# Patient Record
Sex: Female | Born: 1970
Health system: Southern US, Community
[De-identification: ages and names within clinical notes are randomized; demographics above are authoritative.]

## PROBLEM LIST (undated history)

## (undated) DIAGNOSIS — L509 Urticaria, unspecified: Secondary | ICD-10-CM

## (undated) DIAGNOSIS — Z8719 Personal history of other diseases of the digestive system: Secondary | ICD-10-CM

## (undated) DIAGNOSIS — K219 Gastro-esophageal reflux disease without esophagitis: Secondary | ICD-10-CM

## (undated) HISTORY — PX: ABDOMINAL HYSTERECTOMY: SHX81

## (undated) HISTORY — PX: TUBAL LIGATION: SHX77

## (undated) HISTORY — DX: Urticaria, unspecified: L50.9

---

## 2002-09-08 ENCOUNTER — Other Ambulatory Visit: Admission: RE | Admit: 2002-09-08 | Discharge: 2002-09-08 | Payer: Self-pay | Admitting: Family Medicine

## 2007-02-09 ENCOUNTER — Other Ambulatory Visit: Admission: RE | Admit: 2007-02-09 | Discharge: 2007-02-09 | Payer: Self-pay | Admitting: *Deleted

## 2007-08-09 ENCOUNTER — Emergency Department (HOSPITAL_COMMUNITY): Admission: EM | Admit: 2007-08-09 | Discharge: 2007-08-10 | Payer: Self-pay | Admitting: Emergency Medicine

## 2007-08-11 ENCOUNTER — Emergency Department (HOSPITAL_COMMUNITY): Admission: EM | Admit: 2007-08-11 | Discharge: 2007-08-11 | Payer: Self-pay | Admitting: Emergency Medicine

## 2008-09-21 ENCOUNTER — Emergency Department (HOSPITAL_COMMUNITY): Admission: EM | Admit: 2008-09-21 | Discharge: 2008-09-21 | Payer: Self-pay | Admitting: Emergency Medicine

## 2009-02-21 ENCOUNTER — Emergency Department (HOSPITAL_COMMUNITY): Admission: EM | Admit: 2009-02-21 | Discharge: 2009-02-21 | Payer: Self-pay | Admitting: Emergency Medicine

## 2009-09-19 ENCOUNTER — Emergency Department (HOSPITAL_COMMUNITY): Admission: EM | Admit: 2009-09-19 | Discharge: 2009-09-19 | Payer: Self-pay | Admitting: Emergency Medicine

## 2010-02-05 ENCOUNTER — Emergency Department (HOSPITAL_COMMUNITY): Admission: EM | Admit: 2010-02-05 | Discharge: 2010-02-05 | Payer: Self-pay | Admitting: Emergency Medicine

## 2010-09-19 ENCOUNTER — Emergency Department (HOSPITAL_COMMUNITY): Admission: EM | Admit: 2010-09-19 | Discharge: 2010-09-19 | Payer: Self-pay | Admitting: Emergency Medicine

## 2011-03-13 LAB — URINALYSIS, ROUTINE W REFLEX MICROSCOPIC
Glucose, UA: NEGATIVE mg/dL
Specific Gravity, Urine: >1.03 — ABNORMAL HIGH (ref 1.005–1.030)
Urobilinogen, UA: 0.2 mg/dL (ref 0.0–1.0)

## 2011-03-13 LAB — URINE MICROSCOPIC-ADD ON

## 2011-03-13 LAB — URINE CULTURE

## 2011-09-02 LAB — DIFFERENTIAL
Basophils Absolute: 0.1
Basophils Relative: 1
Eosinophils Absolute: 0.1
Eosinophils Relative: 1
Eosinophils Relative: 1
Lymphocytes Relative: 25
Monocytes Absolute: 0.5
Monocytes Relative: 6
Neutro Abs: 5.6
Neutrophils Relative %: 68

## 2011-09-02 LAB — COMPREHENSIVE METABOLIC PANEL
ALT: 25
AST: 32
Albumin: 3.4 — ABNORMAL LOW
Alkaline Phosphatase: 71
CO2: 29
Chloride: 100
Chloride: 102
Creatinine, Ser: 0.81
GFR calc Af Amer: >60
GFR calc non Af Amer: >60
Glucose, Bld: 94
Potassium: 4
Potassium: 4.1
Sodium: 135
Total Bilirubin: 0.9
Total Bilirubin: 1
Total Protein: 6.8

## 2011-09-02 LAB — URINALYSIS, ROUTINE W REFLEX MICROSCOPIC
Bilirubin Urine: NEGATIVE
Nitrite: NEGATIVE
Specific Gravity, Urine: 1.02
pH: 7

## 2011-09-02 LAB — CBC
Hemoglobin: 13.1
MCHC: 34.1
MCV: 89.3
Platelets: 227
RBC: 4.29
RDW: 13.3
WBC: 8.4
WBC: 8.8

## 2011-09-02 LAB — LIPASE, BLOOD: Lipase: 25

## 2013-08-05 ENCOUNTER — Encounter (HOSPITAL_COMMUNITY): Payer: Self-pay | Admitting: *Deleted

## 2013-08-05 ENCOUNTER — Emergency Department (HOSPITAL_COMMUNITY)
Admission: EM | Admit: 2013-08-05 | Discharge: 2013-08-05 | Disposition: A | Discharge status: Home / Self Care | Payer: Self-pay | Attending: Emergency Medicine | Admitting: Emergency Medicine

## 2013-08-05 DIAGNOSIS — M79609 Pain in unspecified limb: Secondary | ICD-10-CM | POA: Insufficient documentation

## 2013-08-05 DIAGNOSIS — M5430 Sciatica, unspecified side: Secondary | ICD-10-CM | POA: Insufficient documentation

## 2013-08-05 DIAGNOSIS — M5432 Sciatica, left side: Secondary | ICD-10-CM

## 2013-08-05 DIAGNOSIS — R209 Unspecified disturbances of skin sensation: Secondary | ICD-10-CM | POA: Insufficient documentation

## 2013-08-05 MED ORDER — MELOXICAM 7.5 MG PO TABS: 7.5000 mg | ORAL_TABLET | Freq: Every day | ORAL | Status: DC

## 2013-08-05 MED ORDER — CYCLOBENZAPRINE HCL 10 MG PO TABS: 10.0000 mg | ORAL_TABLET | Freq: Two times a day (BID) | ORAL | Status: DC | PRN

## 2013-08-05 MED ORDER — HYDROCODONE-ACETAMINOPHEN 5-325 MG PO TABS: 1.0000 | ORAL_TABLET | ORAL | Status: DC | PRN

## 2013-08-05 NOTE — ED Provider Notes (Signed)
Medical screening examination/treatment/procedure(s) were performed by non-physician practitioner and as supervising physician I was immediately available for consultation/collaboration.   Glynn Octave, MD 08/05/13 [phone]

## 2013-08-05 NOTE — ED Notes (Signed)
Pain lower back and down lt leg with numbness.  X 3 days. No known injury

## 2013-08-05 NOTE — ED Provider Notes (Signed)
CSN: [id number]     Arrival date & time 08/05/13  1741 History   First MD Initiated Contact with Patient 08/05/13 1812     Chief Complaint  Patient presents with  . Back Pain   (Consider location/radiation/quality/duration/timing/severity/associated sxs/prior Treatment) Patient is a 42 y.o. female presenting with back pain. The history is provided by the patient.  Back Pain Location:  Lumbar spine Quality:  Shooting Radiates to:  L posterior upper leg Pain severity:  Moderate (5/10) Pain is:  Same all the time Onset quality:  Gradual Duration:  3 days Timing:  Constant Progression:  Worsening Chronicity:  New Relieved by:  Nothing Worsened by:  Movement, twisting and standing Ineffective treatments: tylenol. Associated symptoms: leg pain, numbness and tingling   Associated symptoms: no abdominal pain, no bladder incontinence, no bowel incontinence, no dysuria and no fever     History reviewed. No pertinent past medical history. Past Surgical History  Procedure Laterality Date  . Abdominal hysterectomy    . Tubal ligation     History reviewed. No pertinent family history. History  Substance Use Topics  . Smoking status: Never Smoker   . Smokeless tobacco: Not on file  . Alcohol Use: No   OB History   Grav Para Term Preterm Abortions TAB SAB Ect Mult Living                 Review of Systems  Constitutional: Negative for fever.  HENT: Negative for neck pain.   Respiratory: Negative for shortness of breath.   Gastrointestinal: Negative for nausea, vomiting, abdominal pain and bowel incontinence.  Genitourinary: Negative for bladder incontinence and dysuria.  Musculoskeletal: Positive for back pain.  Skin: Negative for wound.  Allergic/Immunologic: Negative for immunocompromised state.  Neurological: Positive for tingling and numbness.  Psychiatric/Behavioral: The patient is not nervous/anxious.     Allergies  Review of patient's allergies indicates no known  allergies.  Home Medications  No current outpatient prescriptions on file. BP 148/76  Pulse 67  Temp(Src) 98.2 F (36.8 C) (Oral)  Resp 16  Ht 5\' 5"  (1.651 m)  Wt 270 lb (122.471 kg)  BMI 44.93 kg/m2  SpO2 98% Physical Exam  Nursing note and vitals reviewed. Constitutional: She is oriented to person, place, and time. She appears well-developed and well-nourished. No distress.  HENT:  Head: Normocephalic and atraumatic.  Eyes: Conjunctivae and EOM are normal.  Neck: Normal range of motion. Neck supple.  Cardiovascular: Normal rate and regular rhythm.   Pulmonary/Chest: Effort normal and breath sounds normal.  Abdominal: Soft. There is no tenderness.  Musculoskeletal:       Back:  Tenderness over the left sciatic nerve with radiation to the left upper leg dorsal aspect. Pedal pulses equal, adequate circulation, good touch sensation. Straight leg raises without difficulty. Pain with bending forward and side to side.  Neurological: She is alert and oriented to person, place, and time. No cranial nerve deficit.  Skin: Skin is warm and dry.  Psychiatric: She has a normal mood and affect. Her behavior is normal.    ED Course  Procedures  MDM  42 y.o. female with sciatica left with pain radiating to the left leg. Will treat with muscle relaxant and pain medications. She will follow up with ortho if symptoms persist.  Discussed with the patient and all questioned fully answered.    Medication List         cyclobenzaprine 10 MG tablet  Commonly known as:  FLEXERIL  Take 1 tablet (  10 mg total) by mouth 2 (two) times daily as needed for muscle spasms.     HYDROcodone-acetaminophen 5-325 MG per tablet  Commonly known as:  NORCO/VICODIN  Take 1 tablet by mouth every 4 (four) hours as needed.     meloxicam 7.5 MG tablet  Commonly known as:  MOBIC  Take 1 tablet (7.5 mg total) by mouth daily.         [facility] Orlene Och, NP 08/05/13 1910

## 2013-11-20 ENCOUNTER — Emergency Department (HOSPITAL_COMMUNITY): Payer: Self-pay

## 2013-11-20 ENCOUNTER — Other Ambulatory Visit: Payer: Self-pay

## 2013-11-20 ENCOUNTER — Encounter (HOSPITAL_COMMUNITY): Payer: Self-pay | Admitting: Emergency Medicine

## 2013-11-20 ENCOUNTER — Emergency Department (HOSPITAL_COMMUNITY)
Admission: EM | Admit: 2013-11-20 | Discharge: 2013-11-20 | Disposition: A | Discharge status: Home / Self Care | Payer: Self-pay | Attending: Emergency Medicine | Admitting: Emergency Medicine

## 2013-11-20 DIAGNOSIS — R110 Nausea: Secondary | ICD-10-CM | POA: Insufficient documentation

## 2013-11-20 DIAGNOSIS — R05 Cough: Secondary | ICD-10-CM | POA: Insufficient documentation

## 2013-11-20 DIAGNOSIS — R51 Headache: Secondary | ICD-10-CM | POA: Insufficient documentation

## 2013-11-20 DIAGNOSIS — Z90710 Acquired absence of both cervix and uterus: Secondary | ICD-10-CM | POA: Insufficient documentation

## 2013-11-20 DIAGNOSIS — R079 Chest pain, unspecified: Secondary | ICD-10-CM | POA: Insufficient documentation

## 2013-11-20 DIAGNOSIS — B9789 Other viral agents as the cause of diseases classified elsewhere: Secondary | ICD-10-CM | POA: Insufficient documentation

## 2013-11-20 DIAGNOSIS — Z9851 Tubal ligation status: Secondary | ICD-10-CM | POA: Insufficient documentation

## 2013-11-20 DIAGNOSIS — B349 Viral infection, unspecified: Secondary | ICD-10-CM

## 2013-11-20 DIAGNOSIS — R42 Dizziness and giddiness: Secondary | ICD-10-CM | POA: Insufficient documentation

## 2013-11-20 DIAGNOSIS — R059 Cough, unspecified: Secondary | ICD-10-CM | POA: Insufficient documentation

## 2013-11-20 LAB — CBC WITH DIFFERENTIAL/PLATELET
Eosinophils Absolute: 0.1 10*3/uL (ref 0.0–0.7)
Eosinophils Relative: 1 % (ref 0–5)
HCT: 38.6 % (ref 36.0–46.0)
Hemoglobin: 13.3 g/dL (ref 12.0–15.0)
Lymphocytes Relative: 49 % — ABNORMAL HIGH (ref 12–46)
Lymphs Abs: 3.2 10*3/uL (ref 0.7–4.0)
MCH: 29.7 pg (ref 26.0–34.0)
MCV: 86.2 fL (ref 78.0–100.0)
Monocytes Absolute: 0.3 10*3/uL (ref 0.1–1.0)
Monocytes Relative: 4 % (ref 3–12)
Platelets: 270 10*3/uL (ref 150–400)
RBC: 4.48 MIL/uL (ref 3.87–5.11)
WBC: 6.6 10*3/uL (ref 4.0–10.5)

## 2013-11-20 LAB — BASIC METABOLIC PANEL
BUN: 10 mg/dL (ref 6–23)
CO2: 25 mEq/L (ref 19–32)
Calcium: 9.8 mg/dL (ref 8.4–10.5)
Creatinine, Ser: 0.83 mg/dL (ref 0.50–1.10)
GFR calc non Af Amer: 85 mL/min — ABNORMAL LOW (ref >90)
Glucose, Bld: 106 mg/dL — ABNORMAL HIGH (ref 70–99)
Sodium: 138 mEq/L (ref 135–145)

## 2013-11-20 MED ORDER — MECLIZINE HCL 12.5 MG PO TABS
25.0000 mg | ORAL_TABLET | Freq: Once | ORAL | Status: AC
  Administered 2013-11-20: 25 mg via ORAL
  Filled 2013-11-20: qty 2

## 2013-11-20 MED ORDER — ONDANSETRON HCL 4 MG/2ML IJ SOLN
4.0000 mg | Freq: Once | INTRAMUSCULAR | Status: AC
  Administered 2013-11-20: 4 mg via INTRAVENOUS
  Filled 2013-11-20: qty 2

## 2013-11-20 MED ORDER — MECLIZINE HCL 25 MG PO TABS: 25.0000 mg | ORAL_TABLET | Freq: Four times a day (QID) | ORAL | Status: DC

## 2013-11-20 MED ORDER — SODIUM CHLORIDE 0.9 % IV BOLUS (SEPSIS)
500.0000 mL | Freq: Once | INTRAVENOUS | Status: AC
  Administered 2013-11-20: 500 mL via INTRAVENOUS

## 2013-11-20 MED ORDER — SODIUM CHLORIDE 0.9 % IV SOLN: INTRAVENOUS | Status: DC

## 2013-11-20 MED ORDER — NAPROXEN 500 MG PO TABS: 500.0000 mg | ORAL_TABLET | Freq: Two times a day (BID) | ORAL | Status: DC

## 2013-11-20 NOTE — ED Notes (Signed)
Pt c/o sudden onset dizziness/nausea/weakness and cp today.

## 2013-11-20 NOTE — ED Provider Notes (Signed)
CSN: [id number]     Arrival date & time 11/20/13  1641 History  This chart was scribed for Shelda Jakes, MD by Quintella Reichert, ED scribe.  This patient was seen in room APA01/APA01 and the patient's care was started at 5:01 PM.   Chief Complaint  Patient presents with  . Dizziness  . Chest Pain    Patient is a 42 y.o. female presenting with dizziness and chest pain. The history is provided by the patient.  Dizziness Quality:  Room spinning Severity:  Moderate Duration:  4 hours Timing:  Constant Chronicity:  New Exacerbated by: head movement. Ineffective treatments:  None tried Associated symptoms: chest pain, headaches and nausea   Associated symptoms: no diarrhea, no shortness of breath, no tinnitus and no vomiting   Chest Pain Pain location:  L chest Pain quality: aching   Pain radiates to:  Does not radiate Pain severity:  Mild Onset quality:  Sudden Duration:  4 hours Timing:  Constant Chronicity:  New Relieved by:  Nothing Worsened by:  Nothing tried Associated symptoms: cough, dizziness, headache and nausea   Associated symptoms: no abdominal pain, no back pain, no fever, no shortness of breath and not vomiting   Risk factors: no aortic disease, no coronary artery disease, no diabetes mellitus, no high cholesterol, no hypertension, not female, no prior DVT/PE and no smoking     HPI Comments: Krista Fowler is a 42 y.o. female who presents to the Emergency Department complaining of 4 hours of sudden-onset mild left-sided chest pain with associated nausea and dizziness.  Pain is described as "a constant ache."  It is not worsened or relieved by anything and currently she rates it at a 4/10.  Shortly after she developed nausea and dizziness described as a room-spinning sensation.  Dizziness is worsened by moving her head suddenly.  She also complains of a mild headache and admits to a mild cough that has been ongoing for the past several weeks.  She denies vomiting,  diarrhea, fever, SOB, abdominal pain, myalgias, or any other associated symptoms.  Pt denies recent sick contacts.  She denies prior h/o chest pain.  She denies h/o heart problems or any other pertinent medical history.  Pt has no PCP.   History reviewed. No pertinent past medical history.  Past Surgical History  Procedure Laterality Date  . Abdominal hysterectomy    . Tubal ligation      No family history on file.  History  Substance Use Topics  . Smoking status: Never Smoker   . Smokeless tobacco: Not on file  . Alcohol Use: No    OB History   Grav Para Term Preterm Abortions TAB SAB Ect Mult Living                  Review of Systems  Constitutional: Negative for fever and chills.  HENT: Negative for congestion, ear pain, rhinorrhea and tinnitus.   Eyes: Negative for visual disturbance.  Respiratory: Positive for cough. Negative for shortness of breath.   Cardiovascular: Positive for chest pain. Negative for leg swelling.  Gastrointestinal: Positive for nausea. Negative for vomiting, abdominal pain and diarrhea.  Genitourinary: Negative for dysuria.  Musculoskeletal: Negative for back pain, myalgias and neck pain.  Skin: Negative for rash.  Neurological: Positive for dizziness and headaches.  Hematological: Does not bruise/bleed easily.  Psychiatric/Behavioral: Negative for confusion.     Allergies  Review of patient's allergies indicates no known allergies.  Home Medications   Current Outpatient  Rx  Name  Route  Sig  Dispense  Refill  . meclizine (ANTIVERT) 25 MG tablet   Oral   Take 1 tablet (25 mg total) by mouth 4 (four) times daily.   28 tablet   0   . naproxen (NAPROSYN) 500 MG tablet   Oral   Take 1 tablet (500 mg total) by mouth 2 (two) times daily.   14 tablet   0    BP 121/87  Pulse 61  Temp(Src) 98.6 F (37 C) (Oral)  Resp 20  Ht 5\' 5"  (1.651 m)  Wt 270 lb (122.471 kg)  BMI 44.93 kg/m2  SpO2 100%  Physical Exam  Nursing note and  vitals reviewed. Constitutional: She is oriented to person, place, and time. She appears well-developed and well-nourished. No distress.  HENT:  Head: Normocephalic and atraumatic.  Mouth/Throat: Mucous membranes are not dry.  Eyes: Conjunctivae and EOM are normal. Pupils are equal, round, and reactive to light.  Neck: Neck supple. No tracheal deviation present.  Cardiovascular: Normal rate, regular rhythm and normal heart sounds.   No murmur heard. Pulmonary/Chest: Effort normal and breath sounds normal. No respiratory distress. She has no wheezes. She has no rales.  Abdominal: Bowel sounds are normal. There is no tenderness.  Musculoskeletal: Normal range of motion.  Lymphadenopathy:    She has no cervical adenopathy.  Neurological: She is alert and oriented to person, place, and time. No cranial nerve deficit.  Skin: Skin is warm and dry.  Psychiatric: She has a normal mood and affect. Her behavior is normal.    ED Course  Procedures (including critical care time)  DIAGNOSTIC STUDIES: Oxygen Saturation is 100% on room air, normal by my interpretation.    COORDINATION OF CARE: 5:07 PM-Informed pt that EKG is unconcerning and symptoms are most likely due to a viral infection.  Discussed treatment plan which includes cardiac workup to rule out other causes with pt at bedside and pt agreed to plan.    Labs Review Labs Reviewed  CBC WITH DIFFERENTIAL - Abnormal; Notable for the following:    Lymphocytes Relative 49 (*)    All other components within normal limits  BASIC METABOLIC PANEL - Abnormal; Notable for the following:    Glucose, Bld 106 (*)    GFR calc non Af Amer 85 (*)    All other components within normal limits  TROPONIN I   Results for orders placed during the hospital encounter of 11/20/13  TROPONIN I      Result Value Range   Troponin I <0.30  <0.30 ng/mL  CBC WITH DIFFERENTIAL      Result Value Range   WBC 6.6  4.0 - 10.5 K/uL   RBC 4.48  3.87 - 5.11 MIL/uL    Hemoglobin 13.3  12.0 - 15.0 g/dL   HCT 16.1  09.6 - 04.5 %   MCV 86.2  78.0 - 100.0 fL   MCH 29.7  26.0 - 34.0 pg   MCHC 34.5  30.0 - 36.0 g/dL   RDW 40.9  81.1 - 91.4 %   Platelets 270  150 - 400 K/uL   Neutrophils Relative % 46  43 - 77 %   Neutro Abs 3.0  1.7 - 7.7 K/uL   Lymphocytes Relative 49 (*) 12 - 46 %   Lymphs Abs 3.2  0.7 - 4.0 K/uL   Monocytes Relative 4  3 - 12 %   Monocytes Absolute 0.3  0.1 - 1.0 K/uL   Eosinophils  Relative 1  0 - 5 %   Eosinophils Absolute 0.1  0.0 - 0.7 K/uL   Basophils Relative 0  0 - 1 %   Basophils Absolute 0.0  0.0 - 0.1 K/uL  BASIC METABOLIC PANEL      Result Value Range   Sodium 138  135 - 145 mEq/L   Potassium 3.6  3.5 - 5.1 mEq/L   Chloride 100  96 - 112 mEq/L   CO2 25  19 - 32 mEq/L   Glucose, Bld 106 (*) 70 - 99 mg/dL   BUN 10  6 - 23 mg/dL   Creatinine, Ser 1.61  0.50 - 1.10 mg/dL   Calcium 9.8  8.4 - 09.6 mg/dL   GFR calc non Af Amer 85 (*) >90 mL/min   GFR calc Af Amer >90  >90 mL/min    Imaging Review Ct Head Wo Contrast  11/20/2013   CLINICAL DATA:  Dizziness and vomiting this morning.  EXAM: CT HEAD WITHOUT CONTRAST  TECHNIQUE: Contiguous axial images were obtained from the base of the skull through the vertex without intravenous contrast.  COMPARISON:  None.  FINDINGS: No mass lesion, mass effect, midline shift, hydrocephalus, hemorrhage. No territorial ischemia or acute infarction. Calvarium intact.  IMPRESSION: Negative CT head.   Electronically Signed   By: Andreas Newport M.D.   On: 11/20/2013 17:54   Dg Chest Portable 1 View  11/20/2013   CLINICAL DATA:  Chest pain.  Dizziness.  Nausea.  EXAM: PORTABLE CHEST - 1 VIEW  COMPARISON:  09/19/2010.  FINDINGS: Cardiopericardial silhouette within normal limits. Mediastinal contours normal. Trachea midline. No airspace disease or effusion.  IMPRESSION: No active disease.   Electronically Signed   By: Andreas Newport M.D.   On: 11/20/2013 17:05    EKG Interpretation   None       Date: 11/20/2013  Rate: 56  Rhythm: sinus bradycardia  QRS Axis: normal  Intervals: normal  ST/T Wave abnormalities: normal  Conduction Disutrbances:none  Narrative Interpretation:   Old EKG Reviewed: none available    MDM   1. Vertigo   2. Viral illness    Symptoms most likely viral in nature and consistent with vertigo. Chest pain workup negative EKG without acute changes. Troponin negative chest x-ray negative for pneumonia pneumothorax or pulmonary edema. Do not feel that this is cardiac chest pain. Patient's symptoms are predominantly of positional vertigo. Most likely due to a sudden onset of a virus it started at 1:00 this afternoon. Patient treated with anti-vert with some improvement but not completely resolved same medication we continued at home referral provided to neurology. Patient's head CT was done as well without any acute cranial abnormalities. If symptoms persist MRI of the brain would be appropriate.    I personally performed the services described in this documentation, which was scribed in my presence. The recorded information has been reviewed and is accurate.     Shelda Jakes, MD 11/20/13 [phone]

## 2013-11-20 NOTE — ED Notes (Signed)
Pt stood up and had no difficulty walking and states dizziness much better.

## 2013-12-16 ENCOUNTER — Other Ambulatory Visit: Payer: Self-pay | Admitting: General Practice

## 2013-12-20 ENCOUNTER — Ambulatory Visit: Payer: Self-pay | Admitting: General Practice

## 2014-05-08 ENCOUNTER — Telehealth: Payer: Self-pay | Admitting: General Practice

## 2014-05-11 NOTE — Telephone Encounter (Signed)
[employer] [facility] for 05/29/14 with [place].  Pt will call to cancel if she can get in somewhere else quicker.

## 2014-05-29 ENCOUNTER — Encounter: Payer: Self-pay | Admitting: Family

## 2014-07-31 ENCOUNTER — Encounter (HOSPITAL_COMMUNITY): Payer: Self-pay | Admitting: Emergency Medicine

## 2014-07-31 ENCOUNTER — Emergency Department (HOSPITAL_COMMUNITY)
Admission: EM | Admit: 2014-07-31 | Discharge: 2014-07-31 | Disposition: A | Discharge status: Home / Self Care | Payer: Self-pay | Attending: Emergency Medicine | Admitting: Emergency Medicine

## 2014-07-31 DIAGNOSIS — K644 Residual hemorrhoidal skin tags: Secondary | ICD-10-CM | POA: Insufficient documentation

## 2014-07-31 DIAGNOSIS — K625 Hemorrhage of anus and rectum: Secondary | ICD-10-CM | POA: Insufficient documentation

## 2014-07-31 LAB — BASIC METABOLIC PANEL
ANION GAP: 10 (ref 5–15)
BUN: 11 mg/dL (ref 6–23)
CHLORIDE: 103 meq/L (ref 96–112)
CO2: 28 mEq/L (ref 19–32)
Calcium: 9.3 mg/dL (ref 8.4–10.5)
Creatinine, Ser: 0.91 mg/dL (ref 0.50–1.10)
GFR calc Af Amer: 88 mL/min — ABNORMAL LOW (ref >90)
GFR calc non Af Amer: 76 mL/min — ABNORMAL LOW (ref >90)
Glucose, Bld: 100 mg/dL — ABNORMAL HIGH (ref 70–99)
POTASSIUM: 4 meq/L (ref 3.7–5.3)
Sodium: 141 mEq/L (ref 137–147)

## 2014-07-31 LAB — CBC WITH DIFFERENTIAL/PLATELET
BASOS PCT: 0 % (ref 0–1)
Basophils Absolute: 0 10*3/uL (ref 0.0–0.1)
Eosinophils Absolute: 0.2 10*3/uL (ref 0.0–0.7)
Eosinophils Relative: 2 % (ref 0–5)
HCT: 37 % (ref 36.0–46.0)
Hemoglobin: 12.7 g/dL (ref 12.0–15.0)
LYMPHS ABS: 3.2 10*3/uL (ref 0.7–4.0)
Lymphocytes Relative: 52 % — ABNORMAL HIGH (ref 12–46)
MCH: 29.5 pg (ref 26.0–34.0)
MCHC: 34.3 g/dL (ref 30.0–36.0)
MCV: 85.8 fL (ref 78.0–100.0)
MONOS PCT: 7 % (ref 3–12)
Monocytes Absolute: 0.5 10*3/uL (ref 0.1–1.0)
NEUTROS ABS: 2.5 10*3/uL (ref 1.7–7.7)
Neutrophils Relative %: 39 % — ABNORMAL LOW (ref 43–77)
PLATELETS: 266 10*3/uL (ref 150–400)
RBC: 4.31 MIL/uL (ref 3.87–5.11)
RDW: 12.9 % (ref 11.5–15.5)
WBC: 6.4 10*3/uL (ref 4.0–10.5)

## 2014-07-31 LAB — URINALYSIS, ROUTINE W REFLEX MICROSCOPIC
Bilirubin Urine: NEGATIVE
Glucose, UA: NEGATIVE mg/dL
HGB URINE DIPSTICK: NEGATIVE
Ketones, ur: NEGATIVE mg/dL
NITRITE: NEGATIVE
Protein, ur: NEGATIVE mg/dL
SPECIFIC GRAVITY, URINE: 1.02 (ref 1.005–1.030)
Urobilinogen, UA: 1 mg/dL (ref 0.0–1.0)
pH: 7 (ref 5.0–8.0)

## 2014-07-31 LAB — URINE MICROSCOPIC-ADD ON

## 2014-07-31 NOTE — Discharge Instructions (Signed)
Hemorrhoids Hemorrhoids are swollen veins around the rectum or anus. There are two types of hemorrhoids:   Internal hemorrhoids. These occur in the veins just inside the rectum. They may poke through to the outside and become irritated and painful.  External hemorrhoids. These occur in the veins outside the anus and can be felt as a painful swelling or hard lump near the anus. CAUSES  Pregnancy.   Obesity.   Constipation or diarrhea.   Straining to have a bowel movement.   Sitting for long periods on the toilet.  Heavy lifting or other activity that caused you to strain.  Anal intercourse. SYMPTOMS   Pain.   Anal itching or irritation.   Rectal bleeding.   Fecal leakage.   Anal swelling.   One or more lumps around the anus.  DIAGNOSIS  Your caregiver may be able to diagnose hemorrhoids by visual examination. Other examinations or tests that may be performed include:   Examination of the rectal area with a gloved hand (digital rectal exam).   Examination of anal canal using a small tube (scope).   A blood test if you have lost a significant amount of blood.  A test to look inside the colon (sigmoidoscopy or colonoscopy). TREATMENT Most hemorrhoids can be treated at home. However, if symptoms do not seem to be getting better or if you have a lot of rectal bleeding, your caregiver may perform a procedure to help make the hemorrhoids get smaller or remove them completely. Possible treatments include:   Placing a rubber band at the base of the hemorrhoid to cut off the circulation (rubber band ligation).   Injecting a chemical to shrink the hemorrhoid (sclerotherapy).   Using a tool to burn the hemorrhoid (infrared light therapy).   Surgically removing the hemorrhoid (hemorrhoidectomy).   Stapling the hemorrhoid to block blood flow to the tissue (hemorrhoid stapling).  HOME CARE INSTRUCTIONS   Eat foods with fiber, such as whole grains, beans,  nuts, fruits, and vegetables. Ask your doctor about taking products with added fiber in them (fibersupplements).  You may also take Miralax and Colace over the counter as needed to keep your stool soft.  Increase fluid intake. Drink enough water and fluids to keep your urine clear or pale yellow.   Exercise regularly.   Go to the bathroom when you have the urge to have a bowel movement. Do not wait.   Avoid straining to have bowel movements.   Keep the anal area dry and clean. Use wet toilet paper or moist towelettes after a bowel movement.   Medicated creams and suppositories may be used or applied as directed. --->  Preparation H, Tuck's witch hazel wipes  Only take over-the-counter or prescription medicines as directed by your caregiver.   Take warm sitz baths for 15-20 minutes, 3-4 times a day to ease pain and discomfort.   Place ice packs on the hemorrhoids if they are tender and swollen. Using ice packs between sitz baths may be helpful.   Put ice in a plastic bag.   Place a towel between your skin and the bag.   Leave the ice on for 15-20 minutes, 3-4 times a day.   Do not use a donut-shaped pillow or sit on the toilet for long periods. This increases blood pooling and pain.  SEEK MEDICAL CARE IF:  You have increasing pain and swelling that is not controlled by treatment or medicine.  You have uncontrolled bleeding.  You have difficulty or you are  unable to have a bowel movement.  You have pain or inflammation outside the area of the hemorrhoids. MAKE SURE YOU:  Understand these instructions.  Will watch your condition.  Will get help right away if you are not doing well or get worse. Document Released: 11/14/2000 Document Revised: 11/03/2012 Document Reviewed: 09/21/2012 [facility] Patient Information 2015 [place], [place]. This information is not intended to replace advice given to you by your health care provider. Make sure you discuss any questions  you have with your health care provider.

## 2014-07-31 NOTE — ED Provider Notes (Signed)
TIME SEEN: 5:29 PM  CHIEF COMPLAINT: Rectal bleeding  HPI: Patient is a 43 year old female with no significant past medical history who presents to the emergency department with bright red blood per rectum with bowel movements twice today. She states that she saw bright red blood on the toilet paper and in the toilet. No clots. No melena. She did have a bloated feeling of her abdomen today and some nausea both resolved. She denies a history of prior GI bleed. She is not on anticoagulation. No prior history of endoscopy or colonoscopy. No history of NSAID use or alcohol use. No family history of inflammatory bowel disease or colon cancer. Denies fevers, chills, nausea, vomiting or diarrhea. Denies constipation. Denies using any rectal foreign bodies or anal sex.  Patient is status post BTL, hysterectomy.  ROS: See HPI Constitutional: no fever  Eyes: no drainage  ENT: no runny nose   Cardiovascular:  no chest pain  Resp: no SOB  GI: no vomiting GU: no dysuria Integumentary: no rash  Allergy: no hives  Musculoskeletal: no leg swelling  Neurological: no slurred speech ROS otherwise negative  PAST MEDICAL HISTORY/PAST SURGICAL HISTORY:  History reviewed. No pertinent past medical history.  MEDICATIONS:  Prior to Admission medications   Not on File    ALLERGIES:  No Known Allergies  SOCIAL HISTORY:  History  Substance Use Topics  . Smoking status: Never Smoker   . Smokeless tobacco: Not on file  . Alcohol Use: No    FAMILY HISTORY: No family history on file.  EXAM: BP 156/97  Pulse 85  Temp(Src) 98.7 F (37.1 C) (Oral)  Resp 20  Ht  (1.651 m)  Wt 275 lb (124.739 kg)  BMI 45.76 kg/m2  SpO2 100% CONSTITUTIONAL: Alert and oriented and responds appropriately to questions. Well-appearing; well-nourished HEAD: Normocephalic EYES: Conjunctivae clear, PERRL ENT: normal nose; no rhinorrhea; moist mucous membranes; pharynx without lesions noted NECK: Supple, no  meningismus, no LAD  CARD: RRR; S1 and S2 appreciated; no murmurs, no clicks, no rubs, no gallops RESP: Normal chest excursion without splinting or tachypnea; breath sounds clear and equal bilaterally; no wheezes, no rhonchi, no rales,  ABD/GI: Normal bowel sounds; non-distended; soft, non-tender, no rebound, no guarding RECTAL:  Patient has 2 small external hemorrhoids and one large bleeding nonthrombosed external hemorrhoid, there is a minimal amount of blood coming from this hemorrhoid, no melena, normal rectal tone, no crepitus or erythema or fluctuance or induration of the perineum around the rectum BACK:  The back appears normal and is non-tender to palpation, there is no CVA tenderness EXT: Normal ROM in all joints; non-tender to palpation; no edema; normal capillary refill; no cyanosis    SKIN: Normal color for age and race; warm NEURO: Moves all extremities equally PSYCH: The patient's mood and manner are appropriate. Grooming and personal hygiene are appropriate.  MEDICAL DECISION MAKING: Patient here with a bleeding hemorrhoid. Discussed with her that I think is the cause of her bright red blood per rectum. Basic labs and urine sent from triage are normal. Have discussed with her return precautions and supportive care instructions including increasing her water and fiber intake, keeping her stool soft with MiraLAX and Colace, supportive treatment over-the-counter such as Preparation H and witch hazel wipes.  She verbalized understanding and is comfortable with plan.       Layla Maw Ward, DO 07/31/14 [phone]

## 2014-07-31 NOTE — ED Notes (Signed)
Pt reports this morning around 0900 had a BM and when wiped saw " right much " bright red blood.  Pt says had another episode this afternoon.  Pt says just started a few min ago having some intermittent abd pain.  Denies any nausea or vomiting.

## 2014-12-12 ENCOUNTER — Telehealth: Payer: Self-pay | Admitting: Family

## 2014-12-12 NOTE — Telephone Encounter (Signed)
Patient has uhc and currently is not on any medications. Appointment given for 01/12/15 with Jannifer Rodneyhristy Hawks, FNP.

## 2015-01-12 ENCOUNTER — Encounter: Payer: Self-pay | Admitting: Family

## 2015-01-12 ENCOUNTER — Encounter (INDEPENDENT_AMBULATORY_CARE_PROVIDER_SITE_OTHER): Payer: Self-pay

## 2015-01-12 ENCOUNTER — Ambulatory Visit (INDEPENDENT_AMBULATORY_CARE_PROVIDER_SITE_OTHER): Payer: 59 | Admitting: Family

## 2015-01-12 VITALS — BP 116/78 | HR 97 | Temp 97.4°F | Ht 65.0 in | Wt 282.5 lb

## 2015-01-12 DIAGNOSIS — R600 Localized edema: Secondary | ICD-10-CM

## 2015-01-12 DIAGNOSIS — R300 Dysuria: Secondary | ICD-10-CM

## 2015-01-12 DIAGNOSIS — R609 Edema, unspecified: Secondary | ICD-10-CM

## 2015-01-12 DIAGNOSIS — Z0000 Encounter for general adult medical examination without abnormal findings: Secondary | ICD-10-CM

## 2015-01-12 DIAGNOSIS — Z01419 Encounter for gynecological examination (general) (routine) without abnormal findings: Secondary | ICD-10-CM

## 2015-01-12 DIAGNOSIS — N3281 Overactive bladder: Secondary | ICD-10-CM

## 2015-01-12 LAB — POCT URINALYSIS DIPSTICK
Bilirubin, UA: NEGATIVE
GLUCOSE UA: NEGATIVE
Ketones, UA: NEGATIVE
NITRITE UA: NEGATIVE
Spec Grav, UA: 1.025
UROBILINOGEN UA: NEGATIVE
pH, UA: 6

## 2015-01-12 LAB — POCT UA - MICROSCOPIC ONLY
Bacteria, U Microscopic: NEGATIVE
CASTS, UR, LPF, POC: NEGATIVE
Crystals, Ur, HPF, POC: NEGATIVE
Yeast, UA: NEGATIVE

## 2015-01-12 MED ORDER — SULFAMETHOXAZOLE-TRIMETHOPRIM 800-160 MG PO TABS: 1.0000 | ORAL_TABLET | Freq: Two times a day (BID) | ORAL | Status: DC

## 2015-01-12 MED ORDER — MIRABEGRON ER 25 MG PO TB24: 25.0000 mg | ORAL_TABLET | Freq: Every day | ORAL | Status: DC

## 2015-01-12 NOTE — Progress Notes (Signed)
   Subjective:    Patient ID: Krista Fowler, female    DOB: 23-Feb-1971, 44 y.o.   MRN: [id number]  Pt presents to the office for CP with pap. Pt currently not taking any medications. Pt denies any headache, palpitations, or SOB. Pt states she does have intermittent fluid in bilateral feet. PT also states that she has some back pain at times that shoot pain into her right leg with numbness and tingling. Pt has not taken any medications for these issues.   Gynecologic Exam Pertinent negatives include no headaches.      Review of Systems  Constitutional: Negative.   HENT: Negative.   Eyes: Negative.   Respiratory: Negative.  Negative for shortness of breath.   Cardiovascular: Positive for leg swelling. Negative for palpitations.  Gastrointestinal: Negative.   Endocrine: Negative.   Genitourinary: Negative.   Musculoskeletal: Negative.   Neurological: Negative.  Negative for headaches.  Hematological: Negative.   Psychiatric/Behavioral: Negative.   All other systems reviewed and are negative.      Objective:   Physical Exam  Constitutional: She is oriented to person, place, and time. She appears well-developed and well-nourished. No distress.  HENT:  Head: Normocephalic and atraumatic.  Right Ear: External ear normal.  Mouth/Throat: Oropharynx is clear and moist.  Eyes: Pupils are equal, round, and reactive to light.  Neck: Normal range of motion. Neck supple. No thyromegaly present.  Cardiovascular: Normal rate, regular rhythm, normal heart sounds and intact distal pulses.   No murmur heard. Pulmonary/Chest: Effort normal and breath sounds normal. No respiratory distress. She has no wheezes. Right breast exhibits no inverted nipple, no mass, no nipple discharge, no skin change and no tenderness. Left breast exhibits no inverted nipple, no mass, no nipple discharge, no skin change and no tenderness. Breasts are symmetrical.  Abdominal: Soft. Bowel sounds are normal. She exhibits  no distension. There is no tenderness.  Genitourinary:  Bimanual exam- no adnexal masses or tenderness, ovaries nonpalpable   Cervix parous and pink- No discharge   Musculoskeletal: Normal range of motion. She exhibits no edema or tenderness.  Neurological: She is alert and oriented to person, place, and time. She has normal reflexes. No cranial nerve deficit.  Skin: Skin is warm and dry.  Psychiatric: She has a normal mood and affect. Her behavior is normal. Judgment and thought content normal.  Vitals reviewed.   BP 116/78 mmHg  Pulse 97  Temp(Src) 97.4 F (36.3 C) (Oral)  Ht $R'5\' 5"'TH$  (1.651 m)  Wt 282 lb 8 oz (128.141 kg)  BMI 47.01 kg/m2       Assessment & Plan:  1. Encounter for routine gynecological examination - POCT UA - Microscopic Only - POCT urinalysis dipstick - Pap IG w/ reflex to HPV when ASC-U  2. Dysuria - Urine culture  3. Peripheral edema - Brain natriuretic peptide; Future  4. Annual physical exam - CMP14+EGFR; Future - Lipid panel; Future - TSH; Future - Brain natriuretic peptide; Future - Vit D  25 hydroxy (rtn osteoporosis monitoring); Future - Pap IG w/ reflex to HPV when ASC-U  5. OAB (overactive bladder) - mirabegron ER (MYRBETRIQ) 25 MG TB24 tablet; Take 1 tablet (25 mg total) by mouth daily.  Dispense: 90 tablet; Refill: 3   Continue all meds Labs pending Health Maintenance reviewed Diet and exercise encouraged RTO 1 year   Evelina Dun, FNP

## 2015-01-12 NOTE — Patient Instructions (Signed)

## 2015-01-14 LAB — URINE CULTURE

## 2015-01-16 ENCOUNTER — Other Ambulatory Visit (INDEPENDENT_AMBULATORY_CARE_PROVIDER_SITE_OTHER): Payer: 59

## 2015-01-16 DIAGNOSIS — Z0000 Encounter for general adult medical examination without abnormal findings: Secondary | ICD-10-CM

## 2015-01-16 DIAGNOSIS — R609 Edema, unspecified: Secondary | ICD-10-CM

## 2015-01-16 LAB — PAP IG W/ RFLX HPV ASCU: PAP SMEAR COMMENT: 0

## 2015-01-16 NOTE — Progress Notes (Signed)
Lab only 

## 2015-01-17 ENCOUNTER — Other Ambulatory Visit: Payer: Self-pay | Admitting: Family

## 2015-01-17 DIAGNOSIS — E559 Vitamin D deficiency, unspecified: Secondary | ICD-10-CM | POA: Insufficient documentation

## 2015-01-17 LAB — LIPID PANEL
CHOL/HDL RATIO: 2.8 ratio (ref 0.0–4.4)
Cholesterol, Total: 213 mg/dL — ABNORMAL HIGH (ref 100–199)
HDL: 76 mg/dL (ref >39)
LDL Calculated: 120 mg/dL — ABNORMAL HIGH (ref 0–99)
TRIGLYCERIDES: 86 mg/dL (ref 0–149)
VLDL Cholesterol Cal: 17 mg/dL (ref 5–40)

## 2015-01-17 LAB — CMP14+EGFR
ALT: 16 IU/L (ref 0–32)
AST: 27 IU/L (ref 0–40)
Albumin/Globulin Ratio: 1.3 (ref 1.1–2.5)
Albumin: 4.2 g/dL (ref 3.5–5.5)
Alkaline Phosphatase: 76 IU/L (ref 39–117)
BILIRUBIN TOTAL: 0.2 mg/dL (ref 0.0–1.2)
BUN/Creatinine Ratio: 11 (ref 9–23)
BUN: 12 mg/dL (ref 6–24)
CO2: 19 mmol/L (ref 18–29)
Calcium: 9.8 mg/dL (ref 8.7–10.2)
Chloride: 102 mmol/L (ref 97–108)
Creatinine, Ser: 1.14 mg/dL — ABNORMAL HIGH (ref 0.57–1.00)
GFR calc Af Amer: 67 mL/min/{1.73_m2} (ref >59)
GFR, EST NON AFRICAN AMERICAN: 58 mL/min/{1.73_m2} — AB (ref >59)
GLOBULIN, TOTAL: 3.3 g/dL (ref 1.5–4.5)
GLUCOSE: 93 mg/dL (ref 65–99)
POTASSIUM: 4.7 mmol/L (ref 3.5–5.2)
SODIUM: 141 mmol/L (ref 134–144)
TOTAL PROTEIN: 7.5 g/dL (ref 6.0–8.5)

## 2015-01-17 LAB — VITAMIN D 25 HYDROXY (VIT D DEFICIENCY, FRACTURES): Vit D, 25-Hydroxy: 14 ng/mL — ABNORMAL LOW (ref 30.0–100.0)

## 2015-01-17 LAB — BRAIN NATRIURETIC PEPTIDE: BNP: 14.8 pg/mL (ref 0.0–100.0)

## 2015-01-17 LAB — TSH: TSH: 1.13 u[IU]/mL (ref 0.450–4.500)

## 2015-01-17 MED ORDER — VITAMIN D (ERGOCALCIFEROL) 1.25 MG (50000 UNIT) PO CAPS: 50000.0000 [IU] | ORAL_CAPSULE | ORAL | Status: DC

## 2015-01-18 ENCOUNTER — Telehealth: Payer: Self-pay | Admitting: Family

## 2015-01-18 NOTE — Telephone Encounter (Signed)
-----   Message from Junie Spencerhristy A Hawks, FNP sent at 01/17/2015  5:53 PM EST ----- Kidney and liver function stable Cholesterol elevated-Pt needs to be on low fat diet if still increased will need to be on medication Thyroid WNL Vit D levels low-Prescription sent to pharmacy  BNP WNL

## 2015-01-18 NOTE — Telephone Encounter (Signed)
Patient aware.

## 2015-01-22 ENCOUNTER — Telehealth: Payer: Self-pay | Admitting: *Deleted

## 2015-01-22 MED ORDER — OXYBUTYNIN CHLORIDE ER 10 MG PO TB24: 10.0000 mg | ORAL_TABLET | Freq: Every day | ORAL | Status: DC

## 2015-01-22 NOTE — Telephone Encounter (Signed)
Krista Fowler, ins co. Denied mybetriq saying she has to try one of the following and fail, they are: ditropan or ditropan XL otc oxytrol or oxybutnin. Will one of these work. Thanks

## 2015-01-22 NOTE — Telephone Encounter (Signed)
Pt to stop Mybetriq r/t not being covered by pt's insurance. Prescription sent to pharmacy  Of Ditopan XL 10mg  daily.

## 2015-01-22 NOTE — Telephone Encounter (Signed)
Stp advised new rx sent over to pharmacy, pt voiced understanding.

## 2015-01-24 DIAGNOSIS — E785 Hyperlipidemia, unspecified: Secondary | ICD-10-CM | POA: Insufficient documentation

## 2015-01-24 DIAGNOSIS — K219 Gastro-esophageal reflux disease without esophagitis: Secondary | ICD-10-CM | POA: Insufficient documentation

## 2015-03-12 ENCOUNTER — Encounter (INDEPENDENT_AMBULATORY_CARE_PROVIDER_SITE_OTHER): Payer: Self-pay

## 2015-03-12 ENCOUNTER — Ambulatory Visit: Payer: 59 | Admitting: *Deleted

## 2015-03-12 DIAGNOSIS — Z0184 Encounter for antibody response examination: Secondary | ICD-10-CM

## 2015-03-14 LAB — QUANTIFERON TB GOLD ASSAY (BLOOD)

## 2015-03-14 LAB — QUANTIFERON IN TUBE
QFT TB AG MINUS NIL VALUE: <0 IU/mL
QUANTIFERON MITOGEN VALUE: >10 IU/mL
QUANTIFERON TB AG VALUE: 0.07 IU/mL
QUANTIFERON TB GOLD: NEGATIVE
Quantiferon Nil Value: 0.36 IU/mL

## 2015-10-08 ENCOUNTER — Encounter (HOSPITAL_COMMUNITY): Payer: Self-pay | Admitting: Emergency Medicine

## 2015-10-08 ENCOUNTER — Emergency Department (HOSPITAL_COMMUNITY): Payer: Self-pay

## 2015-10-08 ENCOUNTER — Inpatient Hospital Stay (HOSPITAL_COMMUNITY)
Admission: EM | Admit: 2015-10-08 | Discharge: 2015-10-10 | DRG: 392 | Disposition: A | Discharge status: Home / Self Care | Payer: Self-pay | Attending: Internal Medicine | Admitting: Internal Medicine

## 2015-10-08 DIAGNOSIS — Z833 Family history of diabetes mellitus: Secondary | ICD-10-CM

## 2015-10-08 DIAGNOSIS — Z8249 Family history of ischemic heart disease and other diseases of the circulatory system: Secondary | ICD-10-CM

## 2015-10-08 DIAGNOSIS — Z6841 Body Mass Index (BMI) 40.0 and over, adult: Secondary | ICD-10-CM

## 2015-10-08 DIAGNOSIS — K5792 Diverticulitis of intestine, part unspecified, without perforation or abscess without bleeding: Secondary | ICD-10-CM | POA: Diagnosis present

## 2015-10-08 DIAGNOSIS — R1032 Left lower quadrant pain: Secondary | ICD-10-CM | POA: Diagnosis present

## 2015-10-08 DIAGNOSIS — Z23 Encounter for immunization: Secondary | ICD-10-CM

## 2015-10-08 DIAGNOSIS — K5732 Diverticulitis of large intestine without perforation or abscess without bleeding: Principal | ICD-10-CM | POA: Diagnosis present

## 2015-10-08 LAB — COMPREHENSIVE METABOLIC PANEL
ALT: 47 U/L (ref 14–54)
AST: 47 U/L — ABNORMAL HIGH (ref 15–41)
Albumin: 3.4 g/dL — ABNORMAL LOW (ref 3.5–5.0)
Alkaline Phosphatase: 84 U/L (ref 38–126)
Anion gap: 8 (ref 5–15)
BUN: 9 mg/dL (ref 6–20)
CHLORIDE: 102 mmol/L (ref 101–111)
CO2: 27 mmol/L (ref 22–32)
Calcium: 8.7 mg/dL — ABNORMAL LOW (ref 8.9–10.3)
Creatinine, Ser: 0.89 mg/dL (ref 0.44–1.00)
GFR calc non Af Amer: >60 mL/min (ref >60)
Glucose, Bld: 109 mg/dL — ABNORMAL HIGH (ref 65–99)
Potassium: 3.9 mmol/L (ref 3.5–5.1)
SODIUM: 137 mmol/L (ref 135–145)
Total Bilirubin: 0.8 mg/dL (ref 0.3–1.2)
Total Protein: 7.5 g/dL (ref 6.5–8.1)

## 2015-10-08 LAB — URINALYSIS, ROUTINE W REFLEX MICROSCOPIC
Bilirubin Urine: NEGATIVE
Glucose, UA: NEGATIVE mg/dL
Ketones, ur: NEGATIVE mg/dL
Leukocytes, UA: NEGATIVE
Nitrite: NEGATIVE
Protein, ur: NEGATIVE mg/dL
Specific Gravity, Urine: 1.01 (ref 1.005–1.030)
UROBILINOGEN UA: 0.2 mg/dL (ref 0.0–1.0)
pH: 7.5 (ref 5.0–8.0)

## 2015-10-08 LAB — CBC WITH DIFFERENTIAL/PLATELET
Basophils Absolute: 0 10*3/uL (ref 0.0–0.1)
Basophils Relative: 0 %
EOS ABS: 0 10*3/uL (ref 0.0–0.7)
Eosinophils Relative: 1 %
HCT: 37.8 % (ref 36.0–46.0)
Hemoglobin: 12.5 g/dL (ref 12.0–15.0)
Lymphocytes Relative: 25 %
Lymphs Abs: 2.2 10*3/uL (ref 0.7–4.0)
MCH: 29 pg (ref 26.0–34.0)
MCHC: 33.1 g/dL (ref 30.0–36.0)
MCV: 87.7 fL (ref 78.0–100.0)
Monocytes Absolute: 0.6 10*3/uL (ref 0.1–1.0)
Monocytes Relative: 7 %
Neutro Abs: 5.9 10*3/uL (ref 1.7–7.7)
Neutrophils Relative %: 67 %
PLATELETS: 268 10*3/uL (ref 150–400)
RBC: 4.31 MIL/uL (ref 3.87–5.11)
RDW: 12.9 % (ref 11.5–15.5)
WBC: 8.8 10*3/uL (ref 4.0–10.5)

## 2015-10-08 LAB — URINE MICROSCOPIC-ADD ON

## 2015-10-08 LAB — LIPASE, BLOOD: Lipase: 21 U/L (ref 11–51)

## 2015-10-08 MED ORDER — PROMETHAZINE HCL 25 MG/ML IJ SOLN
12.5000 mg | Freq: Four times a day (QID) | INTRAMUSCULAR | Status: DC | PRN
  Administered 2015-10-08 – 2015-10-09 (×2): 12.5 mg via INTRAVENOUS
  Filled 2015-10-08 (×2): qty 1

## 2015-10-08 MED ORDER — METRONIDAZOLE IN NACL 5-0.79 MG/ML-% IV SOLN
500.0000 mg | Freq: Three times a day (TID) | INTRAVENOUS | Status: DC
  Administered 2015-10-08 – 2015-10-10 (×5): 500 mg via INTRAVENOUS
  Filled 2015-10-08 (×5): qty 100

## 2015-10-08 MED ORDER — HYDROMORPHONE HCL 1 MG/ML IJ SOLN
0.5000 mg | Freq: Once | INTRAMUSCULAR | Status: AC
  Administered 2015-10-08: 0.5 mg via INTRAVENOUS
  Filled 2015-10-08: qty 1

## 2015-10-08 MED ORDER — HYDROMORPHONE HCL 1 MG/ML IJ SOLN
1.0000 mg | Freq: Once | INTRAMUSCULAR | Status: AC
  Administered 2015-10-08: 1 mg via INTRAVENOUS
  Filled 2015-10-08: qty 1

## 2015-10-08 MED ORDER — ENOXAPARIN SODIUM 60 MG/0.6ML ~~LOC~~ SOLN
60.0000 mg | SUBCUTANEOUS | Status: DC
  Administered 2015-10-08 – 2015-10-09 (×2): 60 mg via SUBCUTANEOUS
  Filled 2015-10-08 (×2): qty 0.6

## 2015-10-08 MED ORDER — CIPROFLOXACIN IN D5W 400 MG/200ML IV SOLN
400.0000 mg | Freq: Once | INTRAVENOUS | Status: AC
  Administered 2015-10-08: 400 mg via INTRAVENOUS

## 2015-10-08 MED ORDER — ACETAMINOPHEN 650 MG RE SUPP: 650.0000 mg | Freq: Four times a day (QID) | RECTAL | Status: DC | PRN

## 2015-10-08 MED ORDER — METRONIDAZOLE IN NACL 5-0.79 MG/ML-% IV SOLN
500.0000 mg | Freq: Once | INTRAVENOUS | Status: AC
  Administered 2015-10-08: 500 mg via INTRAVENOUS
  Filled 2015-10-08: qty 100

## 2015-10-08 MED ORDER — ONDANSETRON HCL 4 MG/2ML IJ SOLN
4.0000 mg | Freq: Once | INTRAMUSCULAR | Status: AC
  Administered 2015-10-08: 4 mg via INTRAVENOUS
  Filled 2015-10-08: qty 2

## 2015-10-08 MED ORDER — OXYCODONE-ACETAMINOPHEN 5-325 MG PO TABS
1.0000 | ORAL_TABLET | ORAL | Status: DC | PRN
  Administered 2015-10-08 – 2015-10-09 (×2): 2 via ORAL
  Filled 2015-10-08 (×2): qty 2

## 2015-10-08 MED ORDER — ONDANSETRON HCL 4 MG/2ML IJ SOLN
4.0000 mg | Freq: Four times a day (QID) | INTRAMUSCULAR | Status: DC | PRN
  Administered 2015-10-08: 4 mg via INTRAVENOUS
  Filled 2015-10-08 (×2): qty 2

## 2015-10-08 MED ORDER — ONDANSETRON HCL 4 MG PO TABS: 4.0000 mg | ORAL_TABLET | Freq: Four times a day (QID) | ORAL | Status: DC | PRN

## 2015-10-08 MED ORDER — CIPROFLOXACIN IN D5W 400 MG/200ML IV SOLN
400.0000 mg | Freq: Two times a day (BID) | INTRAVENOUS | Status: DC
  Administered 2015-10-08 – 2015-10-10 (×4): 400 mg via INTRAVENOUS
  Filled 2015-10-08 (×4): qty 200

## 2015-10-08 MED ORDER — ACETAMINOPHEN 325 MG PO TABS
650.0000 mg | ORAL_TABLET | Freq: Four times a day (QID) | ORAL | Status: DC | PRN
Start: 2015-10-08 — End: 2015-10-10

## 2015-10-08 MED ORDER — POTASSIUM CHLORIDE IN NACL 20-0.9 MEQ/L-% IV SOLN
INTRAVENOUS | Status: DC
  Administered 2015-10-08 – 2015-10-10 (×4): via INTRAVENOUS

## 2015-10-08 MED ORDER — INFLUENZA VAC SPLIT QUAD 0.5 ML IM SUSY
0.5000 mL | PREFILLED_SYRINGE | INTRAMUSCULAR | Status: AC
  Administered 2015-10-09: 0.5 mL via INTRAMUSCULAR
  Filled 2015-10-08: qty 0.5

## 2015-10-08 MED ORDER — IOHEXOL 300 MG/ML  SOLN
100.0000 mL | Freq: Once | INTRAMUSCULAR | Status: AC | PRN
  Administered 2015-10-08: 100 mL via INTRAVENOUS

## 2015-10-08 NOTE — ED Notes (Signed)
Pt made aware a urine specimen was needed. Pt verbalized understanding and will notify staff when one can be obtained.   

## 2015-10-08 NOTE — ED Notes (Signed)
Having pain since last Wednesday.  Rates pain 10/10 and pain is mostly on the left.

## 2015-10-08 NOTE — ED Provider Notes (Signed)
CSN: [id number]     Arrival date & time 10/08/15  0754 History  By signing my name below, I, Krista Fowler, attest that this documentation has been prepared under the direction and in the presence of Krista Berkshire, MD. Electronically Signed: Soijett Fowler, ED Scribe. 10/08/2015. 8:15 AM.   Chief Complaint  Patient presents with  . Abdominal Pain      Patient is a 44 y.o. female presenting with abdominal pain. The history is provided by the patient (the patient). No language interpreter was used.  Abdominal Pain Pain location:  LLQ Pain quality: cramping   Pain radiates to:  Does not radiate Pain severity:  Moderate (10/10) Onset quality:  Sudden Duration:  6 days Timing:  Intermittent Progression:  Worsening Chronicity:  New Context: awakening from sleep   Relieved by:  Nothing Worsened by:  Nothing tried Ineffective treatments:  None tried Associated symptoms: nausea   Associated symptoms: no chest pain, no chills, no cough, no diarrhea, no fatigue, no fever, no hematuria and no vomiting   Risk factors: multiple surgeries     HPI Comments:  Krista Fowler is a 44 y.o. female who presents to the Emergency Department complaining of 10/10 intermittent, left sided abdominal pain onset 6 days worsening yesterday. She notes that her left sided abdominal pain came on suddenly and she hasn't been able to sleep due to the pain. She notesthat she has had a tubal ligation in 1998 and partial abdominal hysterectomy in 2004. She reports that she still has her ovaries. She states that she is having associated symptoms of nausea. She states that she has not tried any medications for the relief for her symptoms. She denies fever, chills, vomiting, and any other symptoms.    History reviewed. No pertinent past medical history. Past Surgical History  Procedure Laterality Date  . Abdominal hysterectomy    . Tubal ligation     History reviewed. No pertinent family history. Social History   Substance Use Topics  . Smoking status: Never Smoker   . Smokeless tobacco: None  . Alcohol Use: No   OB History    No data available     Review of Systems  Constitutional: Negative for fever, chills, appetite change and fatigue.  HENT: Negative for congestion, ear discharge and sinus pressure.   Eyes: Negative for discharge.  Respiratory: Negative for cough.   Cardiovascular: Negative for chest pain.  Gastrointestinal: Positive for nausea and abdominal pain. Negative for vomiting and diarrhea.  Genitourinary: Negative for frequency and hematuria.  Musculoskeletal: Negative for back pain.  Skin: Negative for rash.  Neurological: Negative for seizures and headaches.  Psychiatric/Behavioral: Negative for hallucinations.      Allergies  Review of patient's allergies indicates no known allergies.  Home Medications   Prior to Admission medications   Medication Sig Start Date End Date Taking? Authorizing Provider  oxybutynin (DITROPAN XL) 10 MG 24 hr tablet Take 1 tablet (10 mg total) by mouth at bedtime. 01/22/15   Junie Spencer, FNP  sulfamethoxazole-trimethoprim (BACTRIM DS,SEPTRA DS) 800-160 MG per tablet Take 1 tablet by mouth 2 (two) times daily. 01/12/15   Junie Spencer, FNP  Vitamin D, Ergocalciferol, (DRISDOL) 50000 UNITS CAPS capsule Take 1 capsule (50,000 Units total) by mouth every 7 (seven) days. 01/17/15   Junie Spencer, FNP   BP 128/66 mmHg  Pulse 86  Temp(Src) 98.3 F (36.8 C) (Oral)  Resp 16  Ht  (1.651 m)  Wt 280 lb (127.007 kg)  BMI 46.59 kg/m2  SpO2 100% Physical Exam  Constitutional: She is oriented to person, place, and time. She appears well-developed.  HENT:  Head: Normocephalic.  Eyes: Conjunctivae and EOM are normal. No scleral icterus.  Neck: Neck supple. No thyromegaly present.  Cardiovascular: Normal rate and regular rhythm.  Exam reveals no gallop and no friction rub.   No murmur heard. Pulmonary/Chest: No stridor. She has no  wheezes. She has no rales. She exhibits no tenderness.  Abdominal: She exhibits no distension. There is tenderness in the left lower quadrant. There is no rebound.  Moderate LLQ tenderness  Musculoskeletal: Normal range of motion. She exhibits no edema.  Lymphadenopathy:    She has no cervical adenopathy.  Neurological: She is oriented to person, place, and time. She exhibits normal muscle tone. Coordination normal.  Skin: No rash noted. No erythema.  Psychiatric: She has a normal mood and affect. Her behavior is normal.  Nursing note and vitals reviewed.   ED Course  Procedures (including critical care time) DIAGNOSTIC STUDIES: Oxygen Saturation is 100% on RA, nl by my interpretation.    COORDINATION OF CARE: 8:13 AM Discussed treatment plan with pt at bedside which includes dilaudid, zofran, CT abdomen pelvis with contrast, labs, and UA and pt agreed to plan.    Labs Review Labs Reviewed  COMPREHENSIVE METABOLIC PANEL - Abnormal; Notable for the following:    Glucose, Bld 109 (*)    Calcium 8.7 (*)    Albumin 3.4 (*)    AST 47 (*)    All other components within normal limits  URINALYSIS, ROUTINE W REFLEX MICROSCOPIC (NOT AT [facility]) - Abnormal; Notable for the following:    APPearance HAZY (*)    Hgb urine dipstick TRACE (*)    All other components within normal limits  URINE MICROSCOPIC-ADD ON - Abnormal; Notable for the following:    Squamous Epithelial / LPF FEW (*)    All other components within normal limits  CBC WITH DIFFERENTIAL/PLATELET  LIPASE, BLOOD    Imaging Review Ct Abdomen Pelvis W Contrast  10/08/2015  CLINICAL DATA:  44 year old female with abdominal pain for 5 days greater on the left. Initial encounter. EXAM: CT ABDOMEN AND PELVIS WITH CONTRAST TECHNIQUE: Multidetector CT imaging of the abdomen and pelvis was performed using the standard protocol following bolus administration of intravenous contrast. CONTRAST:  OMNIPAQUE IOHEXOL 300 MG/ML  SOLN  COMPARISON:  Abdominal radiographic series 10 01/21/2008. FINDINGS: Large body habitus. Negative lung bases. No pericardial or pleural effusion. Borderline to mild cardiomegaly. Mild grade 1 anterolisthesis at L3-L4 is associated with chronic bilateral L3 pars fractures. Chronic disc, and posterior element degeneration at that level. No acute osseous abnormality identified. No pelvic free fluid. Uterus appears surgically absent. Left adnexa seen along the left pelvic sidewall and within normal limits (series 2, image 71). Right adnexa diminutive or absent. Urinary bladder diminutive. Multiple pelvic phleboliths. Stool in the rectum which otherwise appears normal. Mild diverticulosis of the sigmoid colon which is decompressed with no active inflammation. Moderate diverticulosis of the proximal descending colon and at the splenic flexure with no active inflammation. Diverticulosis continues in the transverse colon. There is moderate to severe inflammation at the hepatic flexure and about the distal ascending colon. There are diverticula in this region. There is secondary involvement of the second portion of the duodenum. The pancreatic head appears relatively spared, and there is no peripancreatic inflammation elsewhere. Negative cecum and appendix. Oral contrast has not yet reached the distal small bowel.  Other than the second portion of the duodenum, no dilated or abnormal small bowel loops identified. Negative stomach other than possible small sliding-type hiatal hernia. No abdominal free fluid or free air. The gallbladder is in proximity to the inflamed colon but appears to remain normal. Liver enhancement within normal limits. There is inflammation in the right para renal space in proximity to the abnormal colon, but the right kidney appears to remain normal. Spleen, adrenal glands and portal venous system are within normal limits. Left kidney within normal limits. Major arterial structures are patent. No  lymphadenopathy. IMPRESSION: 1. Acute, moderate, confluent inflammation about the hepatic flexure of colon. Favor acute diverticulitis, less likely infectious colitis. No associated fluid collection or complicating features. 2. Secondary inflammation of the second portion of the duodenum. Other viscera in the region appear to remain normal. 3. Fairly extensive diverticulosis also at the splenic flexure. 4. Incidental chronic L3 pars fractures with grade 1 L3-L4 anterolisthesis, disc, and posterior element degeneration. Electronically Signed   By: Odessa FlemingH  Hall M.D.   On: 10/08/2015 10:25   I have personally reviewed and evaluated these images and lab results as part of my medical decision-making.   EKG Interpretation None      MDM   Final diagnoses:  None   Diagnosis diverticulitis will admit for IV antibiotics   The chart was scribed for me under my direct supervision.  I personally performed the history, physical, and medical decision making and all procedures in the evaluation of this patient.Krista Fowler.    Krista Quijas, MD 10/08/15 [phone]

## 2015-10-08 NOTE — ED Notes (Signed)
Pt finished drinking oral contrast. CT notified.  

## 2015-10-08 NOTE — H&P (Addendum)
[facility] History and Physical  Krista KindredVenissa A Pauwels ZOX:[id number] DOB: December 04, 1970 DOA: 10/08/2015  Referring physician: Bethann BerkshireJoseph Zammit, MD PCP: No PCP Per Patient   Chief Complaint: Abdominal pain   HPI: Krista Fowler is a 44 y.o. female presented with complaints of left-sided abdominal pain with associated nausea. Pain onset one week ago. The pain is intermittent, sometimes radiates to her back and is described as cramps and contractions. Denies vomiting, fever, dysuria, smoking, alcohol use, or ever having diverticulitis. While in the ED found to be afebrile, VSS, and not hypoxic. CT A/P revealed acute diverticulitis. She has been admitted for further treatment. No previous history of diverticulitis in the past.   Review of Systems:  Constitutional:  No weight loss, night sweats, Fevers, chills, fatigue.  HEENT:  No headaches, Difficulty swallowing,Tooth/dental problems,Sore throat,  No sneezing, itching, ear ache, nasal congestion, post nasal drip,  Cardio-vascular:  No chest pain, Orthopnea, PND, swelling in lower extremities, anasarca, dizziness, palpitations  GI:  No heartburn, indigestion, vomiting, diarrhea, change in bowel habits, loss of appetite  Positive for: abdominal pain, nausea.  Resp:  No shortness of breath with exertion or at rest. No excess mucus, no productive cough, No non-productive cough, No coughing up of blood.No change in color of mucus.No wheezing.No chest wall deformity  Skin:  no rash or lesions.  GU:  no dysuria, change in color of urine, no urgency or frequency. No flank pain.  Musculoskeletal:  No joint pain or swelling. No decreased range of motion. No back pain.  Psych:  No change in mood or affect. No depression or anxiety. No memory loss.   History reviewed. No pertinent past medical history. Past Surgical History  Procedure Laterality Date  . Abdominal hysterectomy    . Tubal ligation     Social History:  reports that she has never  smoked. She does not have any smokeless tobacco history on file. She reports that she does not drink alcohol or use illicit drugs.  No Known Allergies  Family history: Mother and father hypertension. Father diabetes.  Prior to Admission medications   Medication Sig Start Date End Date Taking? Authorizing Provider  ibuprofen (ADVIL,MOTRIN) 200 MG tablet Take 800 mg by mouth every 6 (six) hours as needed.   Yes Historical Provider, MD   Physical Exam: Filed Vitals:   10/08/15 0806  BP: 128/66  Pulse: 86  Temp: 98.3 F (36.8 C)  TempSrc: Oral  Resp: 16  Height: 5\' 5"  (1.651 m)  Weight: 127.007 kg (280 lb)  SpO2: 100%    Wt Readings from Last 3 Encounters:  10/08/15 127.007 kg (280 lb)  01/12/15 128.141 kg (282 lb 8 oz)  07/31/14 124.739 kg (275 lb)    General:  Appears calm and comfortable. Lying in bed.  Eyes: PERRL, normal lids, irises & conjunctiva ENT: grossly normal hearing, lips & tongue Neck: no LAD, masses or thyromegaly Cardiovascular: RRR, no m/r/g. No LE edema. Respiratory: CTA bilaterally, no w/r/r. Normal respiratory effort. Abdomen: soft, tender in LUQ and LLQ Skin: no rash or induration seen on limited exam Musculoskeletal: grossly normal tone BUE/BLE Psychiatric: grossly normal mood and affect, speech fluent and appropriate Neurologic: grossly non-focal.          Labs on Admission:  Basic Metabolic Panel:  Recent Labs Lab 10/08/15 0906  NA 137  K 3.9  CL 102  CO2 27  GLUCOSE 109*  BUN 9  CREATININE 0.89  CALCIUM 8.7*   Liver Function Tests:  Recent  Labs Lab 10/08/15 0906  AST 47*  ALT 47  ALKPHOS 84  BILITOT 0.8  PROT 7.5  ALBUMIN 3.4*    Recent Labs Lab 10/08/15 0906  LIPASE 21   CBC:  Recent Labs Lab 10/08/15 0906  WBC 8.8  NEUTROABS 5.9  HGB 12.5  HCT 37.8  MCV 87.7  PLT 268     Radiological Exams on Admission: Ct Abdomen Pelvis W Contrast  10/08/2015  CLINICAL DATA:  44 year old female with abdominal pain  for 5 days greater on the left. Initial encounter. EXAM: CT ABDOMEN AND PELVIS WITH CONTRAST TECHNIQUE: Multidetector CT imaging of the abdomen and pelvis was performed using the standard protocol following bolus administration of intravenous contrast. CONTRAST:  OMNIPAQUE IOHEXOL 300 MG/ML  SOLN COMPARISON:  Abdominal radiographic series 10 01/21/2008. FINDINGS: Large body habitus. Negative lung bases. No pericardial or pleural effusion. Borderline to mild cardiomegaly. Mild grade 1 anterolisthesis at L3-L4 is associated with chronic bilateral L3 pars fractures. Chronic disc, and posterior element degeneration at that level. No acute osseous abnormality identified. No pelvic free fluid. Uterus appears surgically absent. Left adnexa seen along the left pelvic sidewall and within normal limits (series 2, image 71). Right adnexa diminutive or absent. Urinary bladder diminutive. Multiple pelvic phleboliths. Stool in the rectum which otherwise appears normal. Mild diverticulosis of the sigmoid colon which is decompressed with no active inflammation. Moderate diverticulosis of the proximal descending colon and at the splenic flexure with no active inflammation. Diverticulosis continues in the transverse colon. There is moderate to severe inflammation at the hepatic flexure and about the distal ascending colon. There are diverticula in this region. There is secondary involvement of the second portion of the duodenum. The pancreatic head appears relatively spared, and there is no peripancreatic inflammation elsewhere. Negative cecum and appendix. Oral contrast has not yet reached the distal small bowel. Other than the second portion of the duodenum, no dilated or abnormal small bowel loops identified. Negative stomach other than possible small sliding-type hiatal hernia. No abdominal free fluid or free air. The gallbladder is in proximity to the inflamed colon but appears to remain normal. Liver enhancement within  normal limits. There is inflammation in the right para renal space in proximity to the abnormal colon, but the right kidney appears to remain normal. Spleen, adrenal glands and portal venous system are within normal limits. Left kidney within normal limits. Major arterial structures are patent. No lymphadenopathy. IMPRESSION: 1. Acute, moderate, confluent inflammation about the hepatic flexure of colon. Favor acute diverticulitis, less likely infectious colitis. No associated fluid collection or complicating features. 2. Secondary inflammation of the second portion of the duodenum. Other viscera in the region appear to remain normal. 3. Fairly extensive diverticulosis also at the splenic flexure. 4. Incidental chronic L3 pars fractures with grade 1 L3-L4 anterolisthesis, disc, and posterior element degeneration. Electronically Signed   By: Odessa Fleming M.D.   On: 10/08/2015 10:25    Assessment/Plan Active Problems:   Acute diverticulitis   Abdominal pain, left lower quadrant   Morbid obesity (HCC)   1. Acute diverticulitis. Started on IV Flagyl and Cipro upon admission. Clear liquid diet, will advance diet as tolerated. If clinically improved can possibly transition to oral abx tomorrow.  2. Abdominal pain, related to number 1. Continue pain management.  3. Morbid Obesity. Counseled on the importance of diet and exercise    Code Status: Full DVT Prophylaxis: Lovenox   Family Communication: Family member bedside.  Disposition Plan: Admit to medical  bed. Anticipate discharge within 24 hours.    Time spent: 50 minutes   Erick Blinks, MD [facility] Pager [phone]   By signing my name below, I, Zadie Cleverly, attest that this documentation has been prepared under the direction and in the presence of Erick Blinks, MD. Electronically signed: Zadie Cleverly, Scribe. 10/08/2015 1:10pm   I, Dr. Erick Blinks, personally performed the services described in this documentaiton. All  medical record entries made by the scribe were at my direction and in my presence. I have reviewed the chart and agree that the record reflects my personal performance and is accurate and complete  Erick Blinks, MD, 10/08/2015 1:41 PM

## 2015-10-09 LAB — BASIC METABOLIC PANEL
Anion gap: 7 (ref 5–15)
BUN: 7 mg/dL (ref 6–20)
CALCIUM: 8.5 mg/dL — AB (ref 8.9–10.3)
CO2: 28 mmol/L (ref 22–32)
CREATININE: 0.93 mg/dL (ref 0.44–1.00)
Chloride: 103 mmol/L (ref 101–111)
GFR calc non Af Amer: >60 mL/min (ref >60)
Glucose, Bld: 94 mg/dL (ref 65–99)
Potassium: 3.4 mmol/L — ABNORMAL LOW (ref 3.5–5.1)
SODIUM: 138 mmol/L (ref 135–145)

## 2015-10-09 LAB — CBC
HCT: 34.1 % — ABNORMAL LOW (ref 36.0–46.0)
Hemoglobin: 11.4 g/dL — ABNORMAL LOW (ref 12.0–15.0)
MCH: 29.6 pg (ref 26.0–34.0)
MCHC: 33.4 g/dL (ref 30.0–36.0)
MCV: 88.6 fL (ref 78.0–100.0)
Platelets: 241 10*3/uL (ref 150–400)
RBC: 3.85 MIL/uL — ABNORMAL LOW (ref 3.87–5.11)
RDW: 13.2 % (ref 11.5–15.5)
WBC: 7 10*3/uL (ref 4.0–10.5)

## 2015-10-09 MED ORDER — POTASSIUM CHLORIDE CRYS ER 20 MEQ PO TBCR
40.0000 meq | EXTENDED_RELEASE_TABLET | Freq: Once | ORAL | Status: AC
  Administered 2015-10-09: 40 meq via ORAL
  Filled 2015-10-09: qty 2

## 2015-10-09 NOTE — Progress Notes (Signed)
[facility] PROGRESS NOTE  Tora KindredVenissa A Garrow ZOX:[id number] DOB: May 07, 1971 DOA: 10/08/2015 PCP: No PCP Per Patient  Assessment/Plan: 1. Acute diverticulitis. Started on IV Flagyl and Cipro upon admission. She continued to have abdominal pain and vomiting yesterday, but appears to be improving today. Will try and advance diet. Continue antibiotics. If she continues to improve, anticipate discharge home in the next 24 hours. 2. Abdominal pain, related to number 1. Continue pain management.  3. Morbid Obesity. Counseled on the importance of diet and exercise  Code Status: Full DVT prophylaxis: Lovenox Family Communication: discussed with patient Disposition Plan: discharge home when improved   Consultants:    Procedures:    Antibiotics:  cipro 11/7>>  Flagyl 11/7>>  HPI/Subjective: Patient had significant vomiting yesterday. Today she is feeling better. Abdominal pain is improving. No bowel movements today  Objective: Filed Vitals:   10/09/15 0606  BP: 101/42  Pulse: 83  Temp: 98.6 F (37 C)  Resp: 16    Intake/Output Summary (Last 24 hours) at 10/09/15 1043 Last data filed at 10/09/15 0844  Gross per 24 hour  Intake   2715 ml  Output      0 ml  Net   2715 ml   Filed Weights   10/08/15 0806  Weight: 127.007 kg (280 lb)    Exam:  General: NAD, looks comfortable Cardiovascular: RRR, S1, S2  Respiratory: clear bilaterally, No wheezing, rales or rhonchi Abdomen: soft, non tender, no distention , bowel sounds normal Musculoskeletal: No edema b/l  Data Reviewed: Basic Metabolic Panel:  Recent Labs Lab 10/08/15 0906 10/09/15 0643  NA 137 138  K 3.9 3.4*  CL 102 103  CO2 27 28  GLUCOSE 109* 94  BUN 9 7  CREATININE 0.89 0.93  CALCIUM 8.7* 8.5*   Liver Function Tests:  Recent Labs Lab 10/08/15 0906  AST 47*  ALT 47  ALKPHOS 84  BILITOT 0.8  PROT 7.5  ALBUMIN 3.4*    Recent Labs Lab 10/08/15 0906  LIPASE 21     Recent  Labs Lab 10/08/15 0906 10/09/15 0643  WBC 8.8 7.0  NEUTROABS 5.9  --   HGB 12.5 11.4*  HCT 37.8 34.1*  MCV 87.7 88.6  PLT 268 241     Recent Labs  01/16/15 0900  BNP 14.8      Studies: Ct Abdomen Pelvis W Contrast  10/08/2015  CLINICAL DATA:  44 year old female with abdominal pain for 5 days greater on the left. Initial encounter. EXAM: CT ABDOMEN AND PELVIS WITH CONTRAST TECHNIQUE: Multidetector CT imaging of the abdomen and pelvis was performed using the standard protocol following bolus administration of intravenous contrast. CONTRAST:  100mL OMNIPAQUE IOHEXOL 300 MG/ML  SOLN COMPARISON:  Abdominal radiographic series 10 01/21/2008. FINDINGS: Large body habitus. Negative lung bases. No pericardial or pleural effusion. Borderline to mild cardiomegaly. Mild grade 1 anterolisthesis at L3-L4 is associated with chronic bilateral L3 pars fractures. Chronic disc, and posterior element degeneration at that level. No acute osseous abnormality identified. No pelvic free fluid. Uterus appears surgically absent. Left adnexa seen along the left pelvic sidewall and within normal limits (series 2, image 71). Right adnexa diminutive or absent. Urinary bladder diminutive. Multiple pelvic phleboliths. Stool in the rectum which otherwise appears normal. Mild diverticulosis of the sigmoid colon which is decompressed with no active inflammation. Moderate diverticulosis of the proximal descending colon and at the splenic flexure with no active inflammation. Diverticulosis continues in the transverse colon. There is moderate to severe inflammation at the  hepatic flexure and about the distal ascending colon. There are diverticula in this region. There is secondary involvement of the second portion of the duodenum. The pancreatic head appears relatively spared, and there is no peripancreatic inflammation elsewhere. Negative cecum and appendix. Oral contrast has not yet reached the distal small bowel. Other than the  second portion of the duodenum, no dilated or abnormal small bowel loops identified. Negative stomach other than possible small sliding-type hiatal hernia. No abdominal free fluid or free air. The gallbladder is in proximity to the inflamed colon but appears to remain normal. Liver enhancement within normal limits. There is inflammation in the right para renal space in proximity to the abnormal colon, but the right kidney appears to remain normal. Spleen, adrenal glands and portal venous system are within normal limits. Left kidney within normal limits. Major arterial structures are patent. No lymphadenopathy. IMPRESSION: 1. Acute, moderate, confluent inflammation about the hepatic flexure of colon. Favor acute diverticulitis, less likely infectious colitis. No associated fluid collection or complicating features. 2. Secondary inflammation of the second portion of the duodenum. Other viscera in the region appear to remain normal. 3. Fairly extensive diverticulosis also at the splenic flexure. 4. Incidental chronic L3 pars fractures with grade 1 L3-L4 anterolisthesis, disc, and posterior element degeneration. Electronically Signed   By: Odessa Fleming M.D.   On: 10/08/2015 10:25    Scheduled Meds: . ciprofloxacin  400 mg Intravenous Q12H  . enoxaparin (LOVENOX) injection  60 mg Subcutaneous Q24H  . Influenza vac split quadrivalent PF  0.5 mL Intramuscular Tomorrow-1000  . metronidazole  500 mg Intravenous Q8H   Continuous Infusions: . 0.9 % NaCl with KCl 20 mEq / L 100 mL/hr at 10/09/15 8295    Active Problems:   Acute diverticulitis   Abdominal pain, left lower quadrant   Morbid obesity (HCC)    Time spent:    Raymonde Hamblin, MD  [facility] Pager [phone]. If 7PM-7AM, please contact night-coverage at [email or web address], password [facility] 10/09/2015, 10:43 AM  LOS: 1 day

## 2015-10-09 NOTE — Plan of Care (Signed)
Problem: Pain Managment: Goal: General experience of comfort will improve Outcome: Progressing Pt denies pain.   Problem: Physical Regulation: Goal: Ability to maintain clinical measurements within normal limits will improve Outcome: Progressing See flowsheet Goal: Will remain free from infection Outcome: Progressing Pt receiving flagyl and cipro.  Problem: Fluid Volume: Goal: Ability to maintain a balanced intake and output will improve Outcome: Progressing See flowsheet.

## 2015-10-09 NOTE — Care Management Note (Signed)
Case Management Note  Patient Details  Name: Krista Fowler MRN: [id number] Date of Birth: 07-08-1971  Subjective/Objective:                  Pt is from home, ind at baseline, admitted with diverticulitis. Pt is currently uninsured but is employed and her insurance will become active in approx 30 days. [facility].   Action/Plan: Pt plans to return home with self care at DC. CM spoke with rep from [facility] and pt will be able to return as a pt and pay OOP with discount and financial assist until her insurance becomes active. No further CM needs anticipated at DC.   Expected Discharge Date:    10/10/2015              Expected Discharge Plan:  Home/Self Care  In-House Referral:  NA  Discharge planning Services  CM Consult  Post Acute Care Choice:  NA Choice offered to:  NA  DME Arranged:    DME Agency:     HH Arranged:    HH Agency:     Status of Service:  Completed, signed off  Medicare Important Message Given:    Date Medicare IM Given:    Medicare IM give by:    Date Additional Medicare IM Given:    Additional Medicare Important Message give by:     If discussed at Long Length of Stay Meetings, dates discussed:    Additional Comments:  Malcolm MetroChildress, Jacquilyn Seldon Demske, RN 10/09/2015, 1:50 PM

## 2015-10-10 DIAGNOSIS — R1032 Left lower quadrant pain: Secondary | ICD-10-CM

## 2015-10-10 DIAGNOSIS — K5792 Diverticulitis of intestine, part unspecified, without perforation or abscess without bleeding: Secondary | ICD-10-CM

## 2015-10-10 LAB — BASIC METABOLIC PANEL
ANION GAP: 6 (ref 5–15)
BUN: 6 mg/dL (ref 6–20)
CHLORIDE: 107 mmol/L (ref 101–111)
CO2: 26 mmol/L (ref 22–32)
Calcium: 8.4 mg/dL — ABNORMAL LOW (ref 8.9–10.3)
Creatinine, Ser: 0.87 mg/dL (ref 0.44–1.00)
Glucose, Bld: 95 mg/dL (ref 65–99)
POTASSIUM: 4.3 mmol/L (ref 3.5–5.1)
SODIUM: 139 mmol/L (ref 135–145)

## 2015-10-10 MED ORDER — METRONIDAZOLE 500 MG PO TABS: 500.0000 mg | ORAL_TABLET | Freq: Three times a day (TID) | ORAL | Status: DC

## 2015-10-10 MED ORDER — OXYCODONE-ACETAMINOPHEN 5-325 MG PO TABS: 1.0000 | ORAL_TABLET | ORAL | Status: DC | PRN

## 2015-10-10 MED ORDER — CIPROFLOXACIN HCL 500 MG PO TABS: 500.0000 mg | ORAL_TABLET | Freq: Two times a day (BID) | ORAL | Status: DC

## 2015-10-10 MED ORDER — POLYETHYLENE GLYCOL 3350 17 G PO PACK: 17.0000 g | PACK | Freq: Every day | ORAL | Status: DC

## 2015-10-10 MED ORDER — POLYETHYLENE GLYCOL 3350 17 G PO PACK
17.0000 g | PACK | Freq: Every day | ORAL | Status: DC
Start: 2015-10-10 — End: 2015-10-10
  Administered 2015-10-10: 17 g via ORAL
  Filled 2015-10-10: qty 1

## 2015-10-10 MED ORDER — SIMETHICONE 40 MG/0.6ML PO SUSP
ORAL | Status: AC
  Filled 2015-10-10: qty 30

## 2015-10-10 NOTE — Care Management Note (Signed)
Case Management Note  Patient Details  Name: Krista Fowler MRN: [id number] Date of Birth: 1971-05-29  Subjective/Objective:                    Action/Plan:   Expected Discharge Date:                  Expected Discharge Plan:  Home/Self Care  In-House Referral:  NA  Discharge planning Services  CM Consult  Post Acute Care Choice:  NA Choice offered to:  NA  DME Arranged:    DME Agency:     HH Arranged:    HH Agency:     Status of Service:  Completed, signed off  Medicare Important Message Given:    Date Medicare IM Given:    Medicare IM give by:    Date Additional Medicare IM Given:    Additional Medicare Important Message give by:     If discussed at Long Length of Stay Meetings, dates discussed:    Additional Comments: Pt discharged home today. No CM needs noted. MATCH voucher not given at this time due to medications being on $4 list and OTC medications. Pt is aware that she can follow up [facility] at discharge.  Krista Fowler, Krista Carradine Lone Jackrowder, RN 10/10/2015, 10:35 AM

## 2015-10-10 NOTE — Discharge Summary (Signed)
Physician Discharge Summary  Krista Fowler ZOX:[id number] DOB: 1971/08/08 DOA: 10/08/2015  PCP: No PCP Per Patient  Admit date: 10/08/2015 Discharge date: 10/10/2015  Time spent: 20 minutes  Recommendations for Outpatient Follow-up:  1. Follow up with PCP in one week   Discharge Diagnoses:  Active Problems:   Acute diverticulitis   Abdominal pain, left lower quadrant   Morbid obesity (HCC)   Discharge Condition: Improved  Diet recommendation: Full liquid, advance as tolerated  Filed Weights   10/08/15 0806  Weight: 127.007 kg (280 lb)    History of present illness:  Please review dictated H and P from 11/7 for details. Briefly, pt presented with abd pain and nausea worsened with food with findings on imaging suggestive of acute diverticulitis. The patient was admitted for further work up.  Hospital Course:  1. Acute diverticulitis. Started on IV Flagyl and Cipro upon admission. Patient continued to have abdominal pain but overall improved and tolerating soft diet. As patient is tolerating PO intake, will transition to PO abx on discharge. 2. Abdominal pain, secondary to number 1. Continue pain management.  3. Morbid Obesity. During this admission, patient had been counseled on the importance of diet and exercise  Discharge Exam: Filed Vitals:   10/09/15 1114 10/09/15 1336 10/09/15 2149 10/10/15 0609  BP:  125/71 141/62 103/49  Pulse:  76 88 78  Temp:  97.7 F (36.5 C) 98.4 F (36.9 C) 98.6 F (37 C)  TempSrc:  Oral Oral Oral  Resp:  Height:      Weight:      SpO2: 98% 100% 95% 100%    General: Awake, in nad Cardiovascular: regular, s1, s2 Respiratory: normal resp effort, no wheezing  Discharge Instructions     Medication List    TAKE these medications        ciprofloxacin 500 MG tablet  Commonly known as:  CIPRO  Take 1 tablet (500 mg total) by mouth 2 (two) times daily.     ibuprofen 200 MG tablet  Commonly known as:  ADVIL,MOTRIN  Take  800 mg by mouth every 6 (six) hours as needed.     metroNIDAZOLE 500 MG tablet  Commonly known as:  FLAGYL  Take 1 tablet (500 mg total) by mouth 3 (three) times daily.     oxyCODONE-acetaminophen 5-325 MG tablet  Commonly known as:  PERCOCET/ROXICET  Take 1-2 tablets by mouth every 4 (four) hours as needed for severe pain.     polyethylene glycol packet  Commonly known as:  MIRALAX / GLYCOLAX  Take 17 g by mouth daily.       No Known Allergies Follow-up Information    Follow up with [facility] On 10/17/2015.   Why:  at 8:55 am   Contact information:   [address] [place] [address] [phone]       The results of significant diagnostics from this hospitalization (including imaging, microbiology, ancillary and laboratory) are listed below for reference.    Significant Diagnostic Studies: Ct Abdomen Pelvis W Contrast  10/08/2015  CLINICAL DATA:  44 year old female with abdominal pain for 5 days greater on the left. Initial encounter. EXAM: CT ABDOMEN AND PELVIS WITH CONTRAST TECHNIQUE: Multidetector CT imaging of the abdomen and pelvis was performed using the standard protocol following bolus administration of intravenous contrast. CONTRAST:  OMNIPAQUE IOHEXOL 300 MG/ML  SOLN COMPARISON:  Abdominal radiographic series 10 01/21/2008. FINDINGS: Large body habitus. Negative lung bases. No pericardial  or pleural effusion. Borderline to mild cardiomegaly. Mild grade 1 anterolisthesis at L3-L4 is associated with chronic bilateral L3 pars fractures. Chronic disc, and posterior element degeneration at that level. No acute osseous abnormality identified. No pelvic free fluid. Uterus appears surgically absent. Left adnexa seen along the left pelvic sidewall and within normal limits (series 2, image 71). Right adnexa diminutive or absent. Urinary bladder diminutive. Multiple pelvic phleboliths. Stool in the rectum which otherwise appears  normal. Mild diverticulosis of the sigmoid colon which is decompressed with no active inflammation. Moderate diverticulosis of the proximal descending colon and at the splenic flexure with no active inflammation. Diverticulosis continues in the transverse colon. There is moderate to severe inflammation at the hepatic flexure and about the distal ascending colon. There are diverticula in this region. There is secondary involvement of the second portion of the duodenum. The pancreatic head appears relatively spared, and there is no peripancreatic inflammation elsewhere. Negative cecum and appendix. Oral contrast has not yet reached the distal small bowel. Other than the second portion of the duodenum, no dilated or abnormal small bowel loops identified. Negative stomach other than possible small sliding-type hiatal hernia. No abdominal free fluid or free air. The gallbladder is in proximity to the inflamed colon but appears to remain normal. Liver enhancement within normal limits. There is inflammation in the right para renal space in proximity to the abnormal colon, but the right kidney appears to remain normal. Spleen, adrenal glands and portal venous system are within normal limits. Left kidney within normal limits. Major arterial structures are patent. No lymphadenopathy. IMPRESSION: 1. Acute, moderate, confluent inflammation about the hepatic flexure of colon. Favor acute diverticulitis, less likely infectious colitis. No associated fluid collection or complicating features. 2. Secondary inflammation of the second portion of the duodenum. Other viscera in the region appear to remain normal. 3. Fairly extensive diverticulosis also at the splenic flexure. 4. Incidental chronic L3 pars fractures with grade 1 L3-L4 anterolisthesis, disc, and posterior element degeneration. Electronically Signed   By: Odessa FlemingH  Hall M.D.   On: 10/08/2015 10:25    Microbiology: No results found for this or any previous visit (from the  past 240 hour(s)).   Labs: Basic Metabolic Panel:  Recent Labs Lab 10/08/15 0906 10/09/15 0643 10/10/15 0547  NA 137 138 139  K 3.9 3.4* 4.3  CL 102 103 107  CO2 27 28 26   GLUCOSE 109* 94 95  BUN 9 7 6   CREATININE 0.89 0.93 0.87  CALCIUM 8.7* 8.5* 8.4*   Liver Function Tests:  Recent Labs Lab 10/08/15 0906  AST 47*  ALT 47  ALKPHOS 84  BILITOT 0.8  PROT 7.5  ALBUMIN 3.4*    Recent Labs Lab 10/08/15 0906  LIPASE 21   No results for input(s): AMMONIA in the last 168 hours. CBC:  Recent Labs Lab 10/08/15 0906 10/09/15 0643  WBC 8.8 7.0  NEUTROABS 5.9  --   HGB 12.5 11.4*  HCT 37.8 34.1*  MCV 87.7 88.6  PLT 268 241   Cardiac Enzymes: No results for input(s): CKTOTAL, CKMB, CKMBINDEX, TROPONINI in the last 168 hours. BNP: BNP (last 3 results)  Recent Labs  01/16/15 0900  BNP 14.8    ProBNP (last 3 results) No results for input(s): PROBNP in the last 8760 hours.  CBG: No results for input(s): GLUCAP in the last 168 hours.   Signed:  Aimar Shrewsbury, Scheryl MartenSTEPHEN K  [facility] 10/10/2015, 4:22 PM

## 2015-10-17 ENCOUNTER — Ambulatory Visit: Payer: 59 | Admitting: Family Medicine

## 2015-10-22 ENCOUNTER — Ambulatory Visit: Payer: 59 | Admitting: Family Medicine

## 2015-10-23 ENCOUNTER — Encounter: Payer: Self-pay | Admitting: General Practice

## 2016-10-16 ENCOUNTER — Ambulatory Visit (INDEPENDENT_AMBULATORY_CARE_PROVIDER_SITE_OTHER): Payer: 59 | Admitting: Family Medicine

## 2016-10-16 ENCOUNTER — Encounter: Payer: Self-pay | Admitting: Family Medicine

## 2016-10-16 VITALS — BP 106/66 | HR 72 | Temp 97.3°F | Ht 65.0 in | Wt 295.4 lb

## 2016-10-16 DIAGNOSIS — Z01419 Encounter for gynecological examination (general) (routine) without abnormal findings: Secondary | ICD-10-CM

## 2016-10-16 DIAGNOSIS — Z131 Encounter for screening for diabetes mellitus: Secondary | ICD-10-CM

## 2016-10-16 DIAGNOSIS — M1711 Unilateral primary osteoarthritis, right knee: Secondary | ICD-10-CM

## 2016-10-16 DIAGNOSIS — Z0000 Encounter for general adult medical examination without abnormal findings: Secondary | ICD-10-CM | POA: Diagnosis not present

## 2016-10-16 DIAGNOSIS — Z13220 Encounter for screening for lipoid disorders: Secondary | ICD-10-CM

## 2016-10-16 DIAGNOSIS — M5432 Sciatica, left side: Secondary | ICD-10-CM

## 2016-10-16 NOTE — Progress Notes (Signed)
BP 106/66   Pulse 72   Temp 97.3 F (36.3 C) (Oral)   Ht '5\' 5"'$  (1.651 m)   Wt 295 lb 6.4 oz (134 kg)   BMI 49.16 kg/m    Subjective:    Patient ID: [name], female    DOB: January 11, 1971, 45 y.o.   MRN: [id number]  HPI: Krista Fowler is a 45 y.o. female presenting on 10/16/2016 for Gynecologic Exam   HPI Well woman exam and Pap Patient is coming in for a well woman exam and Pap smear today. She has had a hysterectomy previously and has not had any vaginal bleeding or discharge or issues that she is complaining of today. She has larger breasts but per her she has not noted any masses or lumps or breast discharge. She is obese and is trying to make some changes for that. She says she is artery started losing some weight. She would like a referral to bariatric surgery as well. She has known arthritis of her right knee and then some left sciatic nerve pain that is been fighting her intermittently. She has been trying ibuprofen to help control it which does okay for some time but now she would like to discuss a referral for physical therapy. The pain that she gets is on her left sciatic nerve going down the back of her left hamstring but not past the knee. She denies any numbness or weakness. The pain in her knee is on the right knee more than the left and she knows that is from her arthritis and her weight and that's why she is trying to work on her weight down. She denies any chest pain, shortness of breath, headaches or vision issues, abdominal complaints, diarrhea, nausea, vomiting, or joint issues.   Relevant past medical, surgical, family and social history reviewed and updated as indicated. Interim medical history since our last visit reviewed. Allergies and medications reviewed and updated.  Review of Systems  Constitutional: Negative for chills and fever.  HENT: Negative for congestion, ear discharge, ear pain and tinnitus.   Eyes: Negative for pain, redness and visual  disturbance.  Respiratory: Negative for cough, chest tightness, shortness of breath and wheezing.   Cardiovascular: Negative for chest pain, palpitations and leg swelling.  Gastrointestinal: Negative for abdominal pain, blood in stool, constipation and diarrhea.  Genitourinary: Negative for difficulty urinating, dysuria, hematuria, vaginal bleeding, vaginal discharge and vaginal pain.  Musculoskeletal: Positive for arthralgias and back pain. Negative for gait problem and myalgias.  Skin: Negative for rash.  Neurological: Negative for dizziness, weakness, light-headedness and headaches.  Psychiatric/Behavioral: Negative for agitation, behavioral problems and suicidal ideas.  All other systems reviewed and are negative.   Per HPI unless specifically indicated above     Medication List       Accurate as of 10/16/16 12:45 PM. Always use your most recent med list.          ibuprofen 200 MG tablet Commonly known as:  ADVIL,MOTRIN Take 800 mg by mouth every 6 (six) hours as needed.          Objective:    BP 106/66   Pulse 72   Temp 97.3 F (36.3 C) (Oral)   Ht '5\' 5"'$  (1.651 m)   Wt 295 lb 6.4 oz (134 kg)   BMI 49.16 kg/m   Wt Readings from Last 3 Encounters:  10/16/16 295 lb 6.4 oz (134 kg)  10/08/15 280 lb (127 kg)  01/12/15 282 lb 8 oz (  128.1 kg)    Physical Exam  Constitutional: She is oriented to person, place, and time. She appears well-developed and well-nourished. No distress.  Eyes: Conjunctivae are normal.  Neck: Neck supple. No thyromegaly present.  Cardiovascular: Normal rate, regular rhythm, normal heart sounds and intact distal pulses.   No murmur heard. Pulmonary/Chest: Effort normal and breath sounds normal. No respiratory distress. She has no wheezes. Right breast exhibits no inverted nipple, no mass, no nipple discharge, no skin change and no tenderness. Left breast exhibits no inverted nipple, no mass, no nipple discharge, no skin change and no  tenderness. Breasts are symmetrical.  Abdominal: Soft. Bowel sounds are normal. She exhibits no distension. There is no tenderness. There is no rebound and no guarding.  Genitourinary: No breast swelling, tenderness, discharge or bleeding. Pelvic exam was performed with patient supine. There is no rash or lesion on the right labia. There is no rash or lesion on the left labia. No tenderness in the vagina. No vaginal discharge (Patient has a blind pouch after a hysterectomy.) found.  Musculoskeletal: Normal range of motion. She exhibits no edema.  Lymphadenopathy:    She has no cervical adenopathy.    She has no axillary adenopathy.  Neurological: She is alert and oriented to person, place, and time. Coordination normal.  Skin: Skin is warm and dry. No rash noted. She is not diaphoretic.  Psychiatric: She has a normal mood and affect. Her behavior is normal.  Vitals reviewed.     Assessment & Plan:   Problem List Items Addressed This Visit      Other   Morbid obesity ([place])   Relevant Orders   Amb ref to Medical Nutrition Therapy-MNT    Other Visit Diagnoses    Well woman exam with routine gynecological exam    -  Primary   Relevant Orders   Pap IG, rfx HPV all pth (Completed)   Screening for diabetes mellitus       Relevant Orders   CMP14+EGFR   Screening, lipid       Relevant Orders   Lipid panel   Osteoarthritis of right knee, unspecified osteoarthritis type       Relevant Orders   Ambulatory referral to Physical Therapy   Left sciatic nerve pain       Will use anti-inflammatories and do a referral to physical therapy   Relevant Orders   Ambulatory referral to Physical Therapy       Follow up plan: Return in about 1 year (around 10/16/2017), or if symptoms worsen or fail to improve.  Counseling provided for all of the vaccine components Orders Placed This Encounter  Procedures  . CMP14+EGFR  . Lipid panel  . Amb ref to Medical Nutrition Therapy-MNT  . Ambulatory  referral to Physical Therapy    Caryl Pina, MD [facility] 10/16/2016, 12:45 PM

## 2016-10-19 LAB — PAP IG, RFX HPV ALL PTH: PAP SMEAR COMMENT: 0

## 2016-10-20 ENCOUNTER — Encounter: Payer: 59 | Admitting: *Deleted

## 2016-10-20 DIAGNOSIS — Z1231 Encounter for screening mammogram for malignant neoplasm of breast: Secondary | ICD-10-CM | POA: Diagnosis not present

## 2016-10-22 ENCOUNTER — Encounter: Payer: Self-pay | Admitting: Family Medicine

## 2016-10-28 ENCOUNTER — Ambulatory Visit: Payer: 59 | Attending: Family Medicine | Admitting: Physical Therapy

## 2016-11-03 ENCOUNTER — Other Ambulatory Visit: Payer: Self-pay | Admitting: Family Medicine

## 2016-11-03 DIAGNOSIS — N63 Unspecified lump in unspecified breast: Secondary | ICD-10-CM

## 2016-11-14 ENCOUNTER — Other Ambulatory Visit: Payer: Self-pay | Admitting: Family Medicine

## 2016-11-14 DIAGNOSIS — N6310 Unspecified lump in the right breast, unspecified quadrant: Secondary | ICD-10-CM

## 2016-11-25 ENCOUNTER — Other Ambulatory Visit: Payer: 59

## 2016-12-15 ENCOUNTER — Ambulatory Visit
Admission: RE | Admit: 2016-12-15 | Discharge: 2016-12-15 | Disposition: A | Discharge status: Home / Self Care | Payer: 59 | Source: Ambulatory Visit | Attending: Family Medicine | Admitting: Family Medicine

## 2016-12-15 DIAGNOSIS — N6310 Unspecified lump in the right breast, unspecified quadrant: Secondary | ICD-10-CM | POA: Diagnosis not present

## 2016-12-15 DIAGNOSIS — N63 Unspecified lump in unspecified breast: Secondary | ICD-10-CM

## 2017-01-12 ENCOUNTER — Emergency Department (HOSPITAL_COMMUNITY)
Admission: EM | Admit: 2017-01-12 | Discharge: 2017-01-12 | Disposition: A | Discharge status: Home / Self Care | Payer: 59 | Attending: Emergency Medicine | Admitting: Emergency Medicine

## 2017-01-12 ENCOUNTER — Encounter (HOSPITAL_COMMUNITY): Payer: Self-pay | Admitting: Emergency Medicine

## 2017-01-12 DIAGNOSIS — R200 Anesthesia of skin: Secondary | ICD-10-CM | POA: Diagnosis not present

## 2017-01-12 DIAGNOSIS — Z791 Long term (current) use of non-steroidal anti-inflammatories (NSAID): Secondary | ICD-10-CM | POA: Diagnosis not present

## 2017-01-12 DIAGNOSIS — M5442 Lumbago with sciatica, left side: Secondary | ICD-10-CM | POA: Diagnosis not present

## 2017-01-12 DIAGNOSIS — M545 Low back pain: Secondary | ICD-10-CM | POA: Diagnosis present

## 2017-01-12 DIAGNOSIS — M5432 Sciatica, left side: Secondary | ICD-10-CM

## 2017-01-12 MED ORDER — CYCLOBENZAPRINE HCL 10 MG PO TABS: 10.0000 mg | ORAL_TABLET | Freq: Three times a day (TID) | ORAL | 0 refills | Status: DC

## 2017-01-12 MED ORDER — DICLOFENAC SODIUM 75 MG PO TBEC: 75.0000 mg | DELAYED_RELEASE_TABLET | Freq: Two times a day (BID) | ORAL | 0 refills | Status: DC

## 2017-01-12 MED ORDER — DEXAMETHASONE 4 MG PO TABS: 4.0000 mg | ORAL_TABLET | Freq: Two times a day (BID) | ORAL | 0 refills | Status: DC

## 2017-01-12 NOTE — ED Provider Notes (Signed)
[facility]Y DEPT Provider Note   CSN: [id number] Arrival date & time: 01/12/17  1007  By signing my name below, I, Cynda Acres, attest that this documentation has been prepared under the direction and in the presence of Ivery Quale PA-C Electronically Signed: Cynda Acres, Scribe. 01/12/17. 12:24 PM.   History   Chief Complaint Chief Complaint  Patient presents with  . Back Pain    HPI Comments: Krista Fowler is a 46 y.o. female with a hx of sciatica, who presents to the Emergency Department complaining of sudden-onset, constant lower back pain that began this morning upon waking up. Patient states she was lying in bed when her pain began. Patient has associated numbness/tingling of the left leg. Patient states she has been having these symptoms intermittently for more than 6 months, patient diagnosed with sciatica. Patient states her episodes last about an entire day. Patient reports taking aleve everyday with improvement. Standing makes her pain worse. Patient states she is eventually starting physical therapy. Patient denies any orthopedic follow-up, back injury, falls, recent procedures/operations, loss of bowel/bladder control, repeated falls, loss of control in legs, or urinary symptoms.   The history is provided by the patient. No language interpreter was used.    History reviewed. No pertinent past medical history.  Patient Active Problem List   Diagnosis Date Noted  . Acute diverticulitis 10/08/2015  . Abdominal pain, left lower quadrant 10/08/2015  . Morbid obesity (HCC) 10/08/2015  . Vitamin D deficiency 01/17/2015    Past Surgical History:  Procedure Laterality Date  . ABDOMINAL HYSTERECTOMY    . TUBAL LIGATION      OB History    No data available       Home Medications    Prior to Admission medications   Medication Sig Start Date End Date Taking? Authorizing Provider  ibuprofen (ADVIL,MOTRIN) 200 MG tablet Take 800 mg by mouth every 6 (six) hours  as needed.    Historical Provider, MD    Family History History reviewed. No pertinent family history.  Social History Social History  Substance Use Topics  . Smoking status: Never Smoker  . Smokeless tobacco: Never Used  . Alcohol use No     Allergies   Patient has no known allergies.   Review of Systems Review of Systems  Constitutional: Negative for chills and fever.  Gastrointestinal: Negative for diarrhea, nausea and vomiting.  Genitourinary: Negative for dysuria, flank pain and urgency.  Musculoskeletal: Positive for back pain (lower).  Neurological: Positive for numbness (left leg). Negative for facial asymmetry and weakness.  All other systems reviewed and are negative.    Physical Exam Updated Vital Signs BP 134/67 (BP Location: Left Arm)   Pulse 62   Temp 98.2 F (36.8 C) (Oral)   Resp 18   Ht 5\' 4"  (1.626 m)   Wt 290 lb (131.5 kg)   SpO2 100%   BMI 49.78 kg/m   Physical Exam  Constitutional: She is oriented to person, place, and time. She appears well-developed.  HENT:  Head: Normocephalic and atraumatic.  Mouth/Throat: Oropharynx is clear and moist.  Eyes: Conjunctivae and EOM are normal. Pupils are equal, round, and reactive to light.  Neck: Normal range of motion. Neck supple.  Cardiovascular: Normal rate, regular rhythm and normal heart sounds.  Exam reveals no gallop and no friction rub.   Pulmonary/Chest: Effort normal and breath sounds normal. No respiratory distress. She has no wheezes. She has no rales.  Symmetrical rise and fall of the chest.  Musculoskeletal: Normal range of motion.  No temperature changes. Dorsalis pedis and posterior tibial pulse are 2+. Good range of motion of right and left knee, no effusion. No sensory deficits to touch. Pain to palpation in the lower lumbar area. Minimal paraspinal tenderness in the lumbar region. Tenderness of the midline lumbar area.   Neurological: She is alert and oriented to person, place, and  time.  No interference with balance, no foot drop, no weakness of the lower extremity.   Skin: Skin is warm and dry.  Psychiatric: She has a normal mood and affect.     ED Treatments / Results  DIAGNOSTIC STUDIES: Oxygen Saturation is 100% on RA, normal by my interpretation.    COORDINATION OF CARE: 12:23 PM Discussed treatment plan with pt at bedside and pt agreed to plan, which includes flexeril and diclofenac.     Labs (all labs ordered are listed, but only abnormal results are displayed) Labs Reviewed - No data to display  EKG  EKG Interpretation None       Radiology No results found.  Procedures Procedures (including critical care time)  Medications Ordered in ED Medications - No data to display   Initial Impression / Assessment and Plan / ED Course  I have reviewed the triage vital signs and the nursing notes.  Pertinent labs & imaging results that were available during my care of the patient were reviewed by me and considered in my medical decision making (see chart for details).     *I have reviewed nursing notes, vital signs, and all appropriate lab and imaging results for this patient.**  Final Clinical Impressions(s) / ED Diagnoses   MDM: Long term hx of lower back pain at rest and with activity. No evidence of cauda equina or other emergent conditions. No hx of recent back surgery. Diagnosed with sciatica in the past. Plan at this time will be heating pad at rest, prescription for flexeril and diclofenac, and referral to primary physician to discuss possible MRI and/or orthopedic evaluation.    Final diagnoses:  Sciatica of left side    New Prescriptions New Prescriptions   No medications on file   **I personally performed the services described in this documentation, which was scribed in my presence. The recorded information has been reviewed and is accurate.Ivery Quale*    Indigo Chaddock, PA-C 01/12/17 1958    Blane OharaJoshua Zavitz, MD 01/13/17 [id number]

## 2017-01-12 NOTE — ED Triage Notes (Signed)
Pt reports low back pain radiating down left hip and leg since waking.  Has seen pcp for same and was told to start physical therapy, but has not.

## 2017-01-12 NOTE — Discharge Instructions (Signed)
Your examination suggest an exacerbation of your sciatica. No acute changes at this time. Please see your primary physician to discuss possible MRI, and or orthopedic referral. Please use a heating pad to your back. Use Flexeril, diclofenac, and Decadron for your pain. Flexeril may cause drowsiness. Please do not drink alcohol, operate machinery, drive a vehicle, or dissipated activities requiring concentration when taking this medication.

## 2017-01-28 ENCOUNTER — Encounter: Payer: Self-pay | Admitting: Family Medicine

## 2017-01-28 ENCOUNTER — Ambulatory Visit (INDEPENDENT_AMBULATORY_CARE_PROVIDER_SITE_OTHER): Payer: 59 | Admitting: Family Medicine

## 2017-01-28 VITALS — BP 122/81 | HR 96 | Temp 97.4°F | Ht 64.0 in | Wt 296.1 lb

## 2017-01-28 DIAGNOSIS — M5440 Lumbago with sciatica, unspecified side: Secondary | ICD-10-CM

## 2017-01-28 NOTE — Progress Notes (Signed)
BP 122/81   Pulse 96   Temp 97.4 F (36.3 C) (Oral)   Ht 5\' 4"  (1.626 m)   Wt 296 lb 2 oz (134.3 kg)   BMI 50.83 kg/m    Subjective:    Patient ID: Krista Fowler, female    DOB: 04-Apr-1971, 46 y.o.   MRN: [id number]  HPI: Krista Fowler is a 46 y.o. female presenting on 01/28/2017 for Back Pain (ongoing, would like to be referred for an MRI)   HPI Low back pain with numbness and left leg Patient has had continued low back pain with numbness going down into her left leg intermittently. The back pain has been persistent and throughout most all the time. It does not keep her up at night. The numbness that goes down into her left leg has been very intermittent and happens maybe a couple times per week and lasts 30 minutes to a couple hours. She thinks the numbness happens more when she is laying down especially if she lays on the left side. But she cannot recall specifically happens during any other times. She denies any weakness or difficulty ambulating but the back painwith prolonged standing or prolonged sitting in one position. She does admit that the weight has been an issue for her and is going to see bariatric's.Patient has been using naproxen and Flexeril and had a course of prednisone and did not feel like these helped much, they did help some but she is still having a lot of issues.  Relevant past medical, surgical, family and social history reviewed and updated as indicated. Interim medical history since our last visit reviewed. Allergies and medications reviewed and updated.  Review of Systems  Constitutional: Negative for chills and fever.  HENT: Negative for congestion, ear discharge and ear pain.   Eyes: Negative for redness and visual disturbance.  Respiratory: Negative for chest tightness and shortness of breath.   Cardiovascular: Negative for chest pain and leg swelling.  Genitourinary: Negative for difficulty urinating and dysuria.  Musculoskeletal: Negative for back  pain and gait problem.  Skin: Negative for rash.  Neurological: Positive for numbness. Negative for weakness, light-headedness and headaches.  Psychiatric/Behavioral: Negative for agitation and behavioral problems.  All other systems reviewed and are negative.   Per HPI unless specifically indicated above     Objective:    BP 122/81   Pulse 96   Temp 97.4 F (36.3 C) (Oral)   Ht 5\' 4"  (1.626 m)   Wt 296 lb 2 oz (134.3 kg)   BMI 50.83 kg/m   Wt Readings from Last 3 Encounters:  01/28/17 296 lb 2 oz (134.3 kg)  01/12/17 290 lb (131.5 kg)  10/16/16 295 lb 6.4 oz (134 kg)    Physical Exam  Constitutional: She is oriented to person, place, and time. She appears well-developed and well-nourished. No distress.  Eyes: Conjunctivae are normal.  Cardiovascular: Normal rate, regular rhythm, normal heart sounds and intact distal pulses.   No murmur heard. Pulmonary/Chest: Effort normal and breath sounds normal. No respiratory distress. She has no wheezes. She has no rales.  Musculoskeletal: Normal range of motion. She exhibits no edema.       Lumbar back: She exhibits tenderness. She exhibits normal range of motion and normal pulse.       Back:  Neurological: She is alert and oriented to person, place, and time. Coordination normal.  Skin: Skin is warm and dry. No rash noted. She is not diaphoretic.  Psychiatric: She  has a normal mood and affect. Her behavior is normal.  Nursing note and vitals reviewed.     Assessment & Plan:   Problem List Items Addressed This Visit    None    Visit Diagnoses    Back pain of lumbar region with sciatica    -  Primary   Left-sided and tingling and numbness, will do referral   Relevant Orders   Ambulatory referral to Orthopedic Surgery       Follow up plan: Return if symptoms worsen or fail to improve.  Counseling provided for all of the vaccine components Orders Placed This Encounter  Procedures  . Ambulatory referral to Orthopedic  Surgery    Arville CareJoshua Sparkle Aube, MD [facility] 01/28/2017, 8:40 AM

## 2017-01-29 ENCOUNTER — Other Ambulatory Visit (HOSPITAL_COMMUNITY): Payer: Self-pay | Admitting: General Surgery

## 2017-02-27 ENCOUNTER — Ambulatory Visit (HOSPITAL_COMMUNITY)
Admission: RE | Admit: 2017-02-27 | Discharge: 2017-02-27 | Disposition: A | Discharge status: Home / Self Care | Payer: 59 | Source: Ambulatory Visit | Attending: General Surgery | Admitting: General Surgery

## 2017-02-27 ENCOUNTER — Other Ambulatory Visit: Payer: Self-pay

## 2017-02-27 DIAGNOSIS — Z01818 Encounter for other preprocedural examination: Secondary | ICD-10-CM | POA: Insufficient documentation

## 2017-02-27 DIAGNOSIS — K219 Gastro-esophageal reflux disease without esophagitis: Secondary | ICD-10-CM | POA: Insufficient documentation

## 2017-02-27 DIAGNOSIS — Z01810 Encounter for preprocedural cardiovascular examination: Secondary | ICD-10-CM | POA: Insufficient documentation

## 2017-02-27 DIAGNOSIS — K449 Diaphragmatic hernia without obstruction or gangrene: Secondary | ICD-10-CM | POA: Diagnosis not present

## 2017-03-16 ENCOUNTER — Ambulatory Visit: Payer: 59 | Admitting: Registered"

## 2017-03-27 ENCOUNTER — Encounter: Payer: 59 | Attending: General Surgery | Admitting: Registered"

## 2017-03-27 ENCOUNTER — Encounter: Payer: Self-pay | Admitting: Registered"

## 2017-03-27 DIAGNOSIS — Z713 Dietary counseling and surveillance: Secondary | ICD-10-CM | POA: Insufficient documentation

## 2017-03-27 NOTE — Progress Notes (Signed)
Pre-Op Assessment Visit:  Pre-Operative Sleeve Gastrectomy Surgery  Medical Nutrition Therapy:  Appt start time: 8:12  End time:  9:45  Patient was seen on 03/27/2017 for Pre-Operative Nutrition Assessment. Assessment and letter of approval faxed to [facility] Surgery Bariatric Surgery Program coordinator on 03/27/2017.   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 Pt reports she has started program before about a year ago but didn't continue because of working part-time & expenses of surgery. Pt states she is starting to take care of self now. Pt states she has had some emotional eating, currently in school working towards RN degree. Pt reports she doesn't eat 3 full course meals a day. Pt reports she loves grapefruit and has cut back tremendously on soda. Pt states she now drinks about 1 soda a week. Pt states she loves vegetables such as brussell sprouts and broccoli. Pt states she has a disc in back that has shifted and currently limits her physical abilities with working out. Pt states she has a hernia that was recently found during her GI endoscopy. Pt states she son is getting married in Oct 2018 and looking forward to having lost weight by then. Pt states that she chews a lot of gum during the day while at work.   Pt states she only needs 1 visit with [place] per insurance.   24 hr Dietary Recall: First Meal: normally skips, bacon, eggs, honey bun, grapefruit, cup of fruit, fast food biscuit Snack: crackers, honeybun Second Meal: fast food ([employer], [employer]) Snack: chips, snickers Third Meal: hot dog, chips, vienna sausage, spam  Snack: none Beverages: water, Lipton green tea, soda (once a wk), juice, cappuccino  Encouraged to engage in 150 minutes of moderate physical  activity including cardiovascular and weight baring weekly  Handouts given during visit include:  . Pre-Op Goals . Bariatric Surgery Protein Shakes . Vitamin and Mineral Recommendations . Arm chair exercises  During the appointment today the following Pre-Op Goals were reviewed with the patient: . Maintain or lose weight as instructed by your surgeon . Make healthy food choices . Begin to limit portion sizes . Limited concentrated sugars and fried foods . Keep fat/sugar in the single digits per serving on          food labels . Practice CHEWING your food  (aim for 30 chews per bite or until applesauce consistency) . Practice not drinking 15 minutes before, during, and 30 minutes after each meal/snack . Avoid all carbonated beverages  . Avoid/limit caffeinated beverages  . Avoid all sugar-sweetened beverages . Consume 3 meals per day; eat every 3-5 hours . Make a list of non-food related activities . Aim for 64-100 ounces of FLUID daily  . Aim for at least 60-80 grams of PROTEIN daily . Look for a liquid protein source that contain ?15 g protein and ?5 g carbohydrate  (ex: shakes, drinks, shots)  Goals: - Increase physical activity walking 15 min/day at work and arm exercises at home 15 min/day - Add in healthy snacks such as fruit, greek yogurt, protein shake  Follow diet recommendations listed below  Energy and Macronutrient Recomendations: Calories: 1600 Carbohydrate: 180 Protein: 120 Fat: 44  Demonstrated degree of understanding via:  Teach Back  Teaching Method Utilized:  Visual Auditory  Hands on  Barriers to learning/adherence to lifestyle change: work-school-life schedule  Patient to call the [facility] to enroll in Pre-Op and Post-Op Nutrition Education when surgery date is scheduled.

## 2017-03-27 NOTE — Patient Instructions (Addendum)
-   Increase physical activity walking 15 min/day at work and arm exercises at home 15 min/day  - Add in healthy snacks such as fruit, greek yogurt, protein shake

## 2017-04-06 ENCOUNTER — Telehealth: Payer: Self-pay

## 2017-04-13 NOTE — Telephone Encounter (Signed)
x

## 2017-04-28 ENCOUNTER — Ambulatory Visit: Payer: 59 | Admitting: Psychiatry

## 2017-05-12 ENCOUNTER — Ambulatory Visit: Payer: 59 | Admitting: Psychiatry

## 2017-05-20 ENCOUNTER — Ambulatory Visit (INDEPENDENT_AMBULATORY_CARE_PROVIDER_SITE_OTHER): Payer: 59 | Admitting: Psychiatry

## 2017-05-20 DIAGNOSIS — F509 Eating disorder, unspecified: Secondary | ICD-10-CM | POA: Diagnosis not present

## 2017-05-28 ENCOUNTER — Ambulatory Visit (INDEPENDENT_AMBULATORY_CARE_PROVIDER_SITE_OTHER): Payer: 59 | Admitting: Psychiatry

## 2017-05-28 DIAGNOSIS — F509 Eating disorder, unspecified: Secondary | ICD-10-CM | POA: Diagnosis not present

## 2017-06-29 NOTE — Progress Notes (Signed)
Place orders in epic for 8-20 surgery please

## 2017-06-30 NOTE — Progress Notes (Signed)
Please place orders in EPIC as patient is being scheduled for a pre-op appointment! Thank you! 

## 2017-07-02 ENCOUNTER — Ambulatory Visit: Payer: Self-pay | Admitting: General Surgery

## 2017-07-06 ENCOUNTER — Encounter: Payer: 59 | Attending: General Surgery | Admitting: Skilled Nursing Facility1

## 2017-07-06 ENCOUNTER — Encounter: Payer: Self-pay | Admitting: Skilled Nursing Facility1

## 2017-07-06 DIAGNOSIS — Z713 Dietary counseling and surveillance: Secondary | ICD-10-CM | POA: Diagnosis not present

## 2017-07-06 NOTE — Progress Notes (Signed)
Pre-Operative Nutrition Class:  Appt start time: 7356   End time:  1830.  Patient was seen on 07/06/2017 for Pre-Operative Bariatric Surgery Education at [facility].   Surgery date: 07/20/2017 Surgery type: Sleeve Start weight at [facility]: 297.14 Weight today: 299.9  Samples given per MNT protocol. Patient educated on appropriate usage: Celebrate Vitamins Multivitamin Lot # [id number] Exp: 05/2018  Celebrate Vitamins Calcium  Lot # [id number] Exp:dec-07-2017  Prmier Protein Shake Lot #[id number] Exp: 24/jan/2019  The following the learning objectives were met by the patient during this course:  Identify Pre-Op Dietary Goals and will begin 2 weeks pre-operatively  Identify appropriate sources of fluids and proteins   State protein recommendations and appropriate sources pre and post-operatively  Identify Post-Operative Dietary Goals and will follow for 2 weeks post-operatively  Identify appropriate multivitamin and calcium sources  Describe the need for physical activity post-operatively and will follow MD recommendations  State when to call healthcare provider regarding medication questions or post-operative complications  Handouts given during class include:  Pre-Op Bariatric Surgery Diet Handout  Protein Shake Handout  Post-Op Bariatric Surgery Nutrition Handout  [facility] Program Information Flyer  Support Group Information Flyer  [facility] Bariatric Supplements Price List  Follow-Up Plan: Patient will follow-up at [facility] 2 weeks post operatively for diet advancement per MD.

## 2017-07-09 NOTE — Patient Instructions (Addendum)
Tora KindredVenissa A Hottel  07/09/2017   Your procedure is scheduled on: 07/20/17  [facility]  Entrance [place]  elevators to 3rd floor to  Short Stay Center at    0530 AM.    Call this number if you have problems the morning of surgery [phone]    Remember: ONLY 1 PERSON MAY GO WITH YOU TO SHORT STAY TO GET  READY MORNING OF YOUR SURGERY.  Do not eat food or drink liquids :After Midnight.     Take these medicines the morning of surgery with A SIP OF WATER: NONE                                You may not have any metal on your body including hair pins and              piercings  Do not wear jewelry, make-up, lotions, powders or perfumes, deodorant             Do not wear nail polish.  Do not shave  48 hours prior to surgery.               Do not bring valuables to the hospital. [facility] IS NOT             RESPONSIBLE   FOR VALUABLES.  Contacts, dentures or bridgework may not be worn into surgery.  Leave suitcase in the car. After surgery it may be brought to your room.                 Please read over the following fact sheets you were given: _____________________________________________________________________            [facility] - Preparing for [facility] Before surgery, you can play an important role.  Because skin is not sterile, your skin needs to be as free of germs as possible.  You can reduce the number of germs on your skin by washing with CHG (chlorahexidine gluconate) soap before surgery.  CHG is an antiseptic cleaner which kills germs and bonds with the skin to continue killing germs even after washing. Please DO NOT use if you have an allergy to CHG or antibacterial soaps.  If your skin becomes reddened/irritated stop using the CHG and inform your nurse when you arrive at Short Stay. Do not shave (including legs and underarms) for at least 48 hours prior to the first CHG shower.  You may shave your face/neck. Please follow these  instructions carefully:  1.  Shower with CHG Soap the night before surgery and the  morning of Surgery.  2.  If you choose to wash your hair, wash your hair first as usual with your  normal  shampoo.  3.  After you shampoo, rinse your hair and body thoroughly to remove the  shampoo.                           4.  Use CHG as you would any other liquid soap.  You can apply chg directly  to the skin and wash                       Gently with a scrungie or clean washcloth.  5.  Apply the CHG Soap to your body  ONLY FROM THE NECK DOWN.   Do not use on face/ open                           Wound or open sores. Avoid contact with eyes, ears mouth and genitals (private parts).                       Wash face,  Genitals (private parts) with your normal soap.             6.  Wash thoroughly, paying special attention to the area where your surgery  will be performed.  7.  Thoroughly rinse your body with warm water from the neck down.  8.  DO NOT shower/wash with your normal soap after using and rinsing off  the CHG Soap.                9.  Pat yourself dry with a clean towel.            10.  Wear clean pajamas.            11.  Place clean sheets on your bed the night of your first shower and do not  sleep with pets. Day of Surgery : Do not apply any lotions/deodorants the morning of surgery.  Please wear clean clothes to the hospital/surgery center.  FAILURE TO FOLLOW THESE INSTRUCTIONS MAY RESULT IN THE CANCELLATION OF YOUR SURGERY PATIENT SIGNATURE_________________________________  NURSE SIGNATURE__________________________________  ________________________________________________________________________   Adam Phenix  An incentive spirometer is a tool that can help keep your lungs clear and active. This tool measures how well you are filling your lungs with each breath. Taking long deep breaths may help reverse or decrease the chance of developing breathing (pulmonary) problems (especially  infection) following:  A long period of time when you are unable to move or be active. BEFORE THE PROCEDURE   If the spirometer includes an indicator to show your best effort, your nurse or respiratory therapist will set it to a desired goal.  If possible, sit up straight or lean slightly forward. Try not to slouch.  Hold the incentive spirometer in an upright position. INSTRUCTIONS FOR USE  1. Sit on the edge of your bed if possible, or sit up as far as you can in bed or on a chair. 2. Hold the incentive spirometer in an upright position. 3. Breathe out normally. 4. Place the mouthpiece in your mouth and seal your lips tightly around it. 5. Breathe in slowly and as deeply as possible, raising the piston or the ball toward the top of the column. 6. Hold your breath for 3-5 seconds or for as long as possible. Allow the piston or ball to fall to the bottom of the column. 7. Remove the mouthpiece from your mouth and breathe out normally. 8. Rest for a few seconds and repeat Steps 1 through 7 at least 10 times every 1-2 hours when you are awake. Take your time and take a few normal breaths between deep breaths. 9. The spirometer may include an indicator to show your best effort. Use the indicator as a goal to work toward during each repetition. 10. After each set of 10 deep breaths, practice coughing to be sure your lungs are clear. If you have an incision (the cut made at the time of surgery), support your incision when coughing by placing a pillow or rolled up towels firmly against it. Once  you are able to get out of bed, walk around indoors and cough well. You may stop using the incentive spirometer when instructed by your caregiver.  RISKS AND COMPLICATIONS  Take your time so you do not get dizzy or light-headed.  If you are in pain, you may need to take or ask for pain medication before doing incentive spirometry. It is harder to take a deep breath if you are having pain. AFTER  USE  Rest and breathe slowly and easily.  It can be helpful to keep track of a log of your progress. Your caregiver can provide you with a simple table to help with this. If you are using the spirometer at home, follow these instructions: [place] IF:   You are having difficultly using the spirometer.  You have trouble using the spirometer as often as instructed.  Your pain medication is not giving enough relief while using the spirometer.  You develop fever of 100.5 F (38.1 C) or higher. SEEK IMMEDIATE MEDICAL CARE IF:   You cough up bloody sputum that had not been present before.  You develop fever of 102 F (38.9 C) or greater.  You develop worsening pain at or near the incision site. MAKE SURE YOU:   Understand these instructions.  Will watch your condition.  Will get help right away if you are not doing well or get worse. Document Released: 03/30/2007 Document Revised: 02/09/2012 Document Reviewed: 05/31/2007 [facility] Patient Information 2014 [place], [place].   ________________________________________________________________________

## 2017-07-14 ENCOUNTER — Encounter (HOSPITAL_COMMUNITY)
Admission: RE | Admit: 2017-07-14 | Discharge: 2017-07-14 | Disposition: A | Discharge status: Home / Self Care | Payer: 59 | Source: Ambulatory Visit | Attending: General Surgery | Admitting: General Surgery

## 2017-07-14 ENCOUNTER — Encounter (HOSPITAL_COMMUNITY): Payer: Self-pay

## 2017-07-14 DIAGNOSIS — E669 Obesity, unspecified: Secondary | ICD-10-CM | POA: Insufficient documentation

## 2017-07-14 DIAGNOSIS — Z01818 Encounter for other preprocedural examination: Secondary | ICD-10-CM | POA: Insufficient documentation

## 2017-07-14 HISTORY — DX: Gastro-esophageal reflux disease without esophagitis: K21.9

## 2017-07-14 HISTORY — DX: Personal history of other diseases of the digestive system: Z87.19

## 2017-07-14 LAB — COMPREHENSIVE METABOLIC PANEL
ALT: 24 U/L (ref 14–54)
AST: 25 U/L (ref 15–41)
Albumin: 3.6 g/dL (ref 3.5–5.0)
Alkaline Phosphatase: 63 U/L (ref 38–126)
Anion gap: 8 (ref 5–15)
BILIRUBIN TOTAL: 0.2 mg/dL — AB (ref 0.3–1.2)
BUN: 13 mg/dL (ref 6–20)
CO2: 28 mmol/L (ref 22–32)
Calcium: 9.7 mg/dL (ref 8.9–10.3)
Chloride: 103 mmol/L (ref 101–111)
Creatinine, Ser: 0.91 mg/dL (ref 0.44–1.00)
GLUCOSE: 108 mg/dL — AB (ref 65–99)
Potassium: 4.5 mmol/L (ref 3.5–5.1)
Sodium: 139 mmol/L (ref 135–145)
TOTAL PROTEIN: 7.7 g/dL (ref 6.5–8.1)

## 2017-07-14 LAB — CBC WITH DIFFERENTIAL/PLATELET
Basophils Absolute: 0 10*3/uL (ref 0.0–0.1)
Basophils Relative: 0 %
EOS PCT: 1 %
Eosinophils Absolute: 0.1 10*3/uL (ref 0.0–0.7)
HEMATOCRIT: 36.7 % (ref 36.0–46.0)
Hemoglobin: 12.4 g/dL (ref 12.0–15.0)
LYMPHS ABS: 3 10*3/uL (ref 0.7–4.0)
LYMPHS PCT: 40 %
MCH: 28.7 pg (ref 26.0–34.0)
MCHC: 33.8 g/dL (ref 30.0–36.0)
MCV: 85 fL (ref 78.0–100.0)
MONO ABS: 0.6 10*3/uL (ref 0.1–1.0)
Monocytes Relative: 8 %
NEUTROS ABS: 3.8 10*3/uL (ref 1.7–7.7)
Neutrophils Relative %: 51 %
PLATELETS: 269 10*3/uL (ref 150–400)
RBC: 4.32 MIL/uL (ref 3.87–5.11)
RDW: 13.5 % (ref 11.5–15.5)
WBC: 7.6 10*3/uL (ref 4.0–10.5)

## 2017-07-14 NOTE — Progress Notes (Signed)
ekg 3/30/18epic cxr 02/27/17 epic

## 2017-07-15 ENCOUNTER — Other Ambulatory Visit: Payer: Self-pay | Admitting: *Deleted

## 2017-07-15 NOTE — Patient Outreach (Signed)
[facility]) Care Management  07/15/2017  Krista KindredVenissa A Fowler 03/23/1971 [id number]  Subjective: Telephone call to patient's home / mobile number, spoke with patient, and HIPAA verified.  [facility]ed [facility] Transition of care follow up, preoperative call follow up, patient voiced understanding, and is in agreement to both types of follow up.  Patient she is having surgery on [facility] currently getting ready for work, and requested call back on 07/16/17.   Objective: Per chart review, patient to be [facility] for LAPAROSCOPIC GASTRIC SLEEVE RESECTION and UPPER ENDO.  Patient has a history of sciatica and diverticulitis.   Assessment: Received UMR Preoperative Follow up call referral on 07/10/17. Transition of care follow up pending patient contact.    Plan: RNCM will call patient for 2nd telephone outreach attempt, preoperative call follow up, within 10 business days if no return call.   Niya Behler H. Gardiner Barefootooper RN, BSN, CCM [facility] Care [facility] Telephonic CM Phone: [phone] Fax: [phone]

## 2017-07-16 ENCOUNTER — Other Ambulatory Visit: Payer: Self-pay | Admitting: *Deleted

## 2017-07-16 NOTE — Patient Outreach (Signed)
[facility]) Care Management  07/16/2017  Krista KindredVenissa A Fowler 23-Dec-1970 [id number]   Subjective: Telephone call to patient's home  / mobile number, no answer, left HIPAA compliant voicemail message, and requested call back.  Objective: Per chart review, patient to be [facility] LAPAROSCOPIC GASTRIC SLEEVE RESECTION and UPPER ENDO.  Patient has a history of sciatica and diverticulitis.   Assessment: Received UMR Preoperative Follow up call referral on 07/10/17. Transition of care follow up pending patient contact.    Plan: RNCM will call patient for 3rd telephone outreach attempt, preoperative call follow up, within 10 business days if no return call.   Krista Smyers H. Gardiner Barefootooper RN, BSN, CCM [facility] Care [facility] Telephonic CM Phone: [phone] Fax: [phone]

## 2017-07-17 ENCOUNTER — Other Ambulatory Visit: Payer: Self-pay | Admitting: *Deleted

## 2017-07-17 NOTE — Patient Outreach (Signed)
[facility]) Care Management  07/17/2017  LOANA WAFER 19-Mar-1971 [id number]  Subjective: Telephone call to patient's home  / mobile number, no answer, left HIPAA compliant voicemail message, and requested call back.  Objective: Per chart review, patient to be admitted on 07/20/17 to [facility] for LAPAROSCOPIC GASTRIC SLEEVE RESECTION and UPPER ENDO. Patient has a history of sciatica and diverticulitis.   Assessment: Received UMR Preoperative Follow up call referral on 07/10/17.Preoperative call not completed due to unable to reach patient, if no return call from patient prior to surgery, will proceed with transition of care follow up.  Transition of care follow up pending patient contact post hospital discharge.    Plan: RNCM will call patient for  telephone outreach attempt, transition of care follow up, within 3 business days of hospital discharge notification.    Christeena Krogh H. Gardiner Barefoot, BSN, CCM [facility] Care Management [employer] Telephonic CM Phone: [phone] Fax: [phone]

## 2017-07-19 NOTE — Anesthesia Preprocedure Evaluation (Addendum)
Anesthesia Evaluation  Patient identified by MRN, date of birth, ID band Patient awake    Reviewed: Allergy & Precautions, NPO status , Patient's Chart, lab work & pertinent test results  History of Anesthesia Complications Negative for: history of anesthetic complications  Airway Mallampati: II  TM Distance: >3 FB Neck ROM: Full    Dental no notable dental hx. (+) Dental Advisory Given   Pulmonary neg pulmonary ROS,    Pulmonary exam normal        Cardiovascular negative cardio ROS Normal cardiovascular exam     Neuro/Psych negative neurological ROS  negative psych ROS   GI/Hepatic Neg liver ROS, hiatal hernia, GERD  ,  Endo/Other  Morbid obesity  Renal/GU negative Renal ROS     Musculoskeletal negative musculoskeletal ROS (+)   Abdominal   Peds  Hematology negative hematology ROS (+)   Anesthesia Other Findings Day of surgery medications reviewed with the patient.  Reproductive/Obstetrics                            Anesthesia Physical Anesthesia Plan  ASA: III  Anesthesia Plan: General   Post-op Pain Management:    Induction:   PONV Risk Score and Plan: 4 or greater and Ondansetron, Dexamethasone, Scopolamine patch - Pre-op and Propofol infusion  Airway Management Planned: Oral ETT  Additional Equipment:   Intra-op Plan:   Post-operative Plan: Extubation in OR  Informed Consent: I have reviewed the patients History and Physical, chart, labs and discussed the procedure including the risks, benefits and alternatives for the proposed anesthesia with the patient or authorized representative who has indicated his/her understanding and acceptance.   Dental advisory given  Plan Discussed with: CRNA, Anesthesiologist and Surgeon  Anesthesia Plan Comments:        Anesthesia Quick Evaluation

## 2017-07-20 ENCOUNTER — Encounter (HOSPITAL_COMMUNITY)
Admission: RE | Disposition: A | Discharge status: Home / Self Care | Payer: Self-pay | Source: Ambulatory Visit | Attending: General Surgery

## 2017-07-20 ENCOUNTER — Inpatient Hospital Stay (HOSPITAL_COMMUNITY): Payer: 59 | Admitting: Anesthesiology

## 2017-07-20 ENCOUNTER — Encounter (HOSPITAL_COMMUNITY): Payer: Self-pay | Admitting: *Deleted

## 2017-07-20 ENCOUNTER — Inpatient Hospital Stay (HOSPITAL_COMMUNITY)
Admission: RE | Admit: 2017-07-20 | Discharge: 2017-07-21 | DRG: 621 | Disposition: A | Discharge status: Home / Self Care | Payer: 59 | Source: Ambulatory Visit | Attending: General Surgery | Admitting: General Surgery

## 2017-07-20 DIAGNOSIS — Z8261 Family history of arthritis: Secondary | ICD-10-CM

## 2017-07-20 DIAGNOSIS — Z883 Allergy status to other anti-infective agents status: Secondary | ICD-10-CM | POA: Diagnosis not present

## 2017-07-20 DIAGNOSIS — Z806 Family history of leukemia: Secondary | ICD-10-CM | POA: Diagnosis not present

## 2017-07-20 DIAGNOSIS — K2950 Unspecified chronic gastritis without bleeding: Secondary | ICD-10-CM | POA: Diagnosis not present

## 2017-07-20 DIAGNOSIS — Z6841 Body Mass Index (BMI) 40.0 and over, adult: Secondary | ICD-10-CM | POA: Diagnosis not present

## 2017-07-20 DIAGNOSIS — Z9851 Tubal ligation status: Secondary | ICD-10-CM | POA: Diagnosis not present

## 2017-07-20 DIAGNOSIS — K449 Diaphragmatic hernia without obstruction or gangrene: Secondary | ICD-10-CM | POA: Diagnosis present

## 2017-07-20 DIAGNOSIS — M1990 Unspecified osteoarthritis, unspecified site: Secondary | ICD-10-CM | POA: Diagnosis present

## 2017-07-20 DIAGNOSIS — R001 Bradycardia, unspecified: Secondary | ICD-10-CM | POA: Diagnosis not present

## 2017-07-20 DIAGNOSIS — Z90710 Acquired absence of both cervix and uterus: Secondary | ICD-10-CM | POA: Diagnosis not present

## 2017-07-20 DIAGNOSIS — M545 Low back pain: Secondary | ICD-10-CM | POA: Diagnosis not present

## 2017-07-20 DIAGNOSIS — E559 Vitamin D deficiency, unspecified: Secondary | ICD-10-CM | POA: Diagnosis not present

## 2017-07-20 DIAGNOSIS — K219 Gastro-esophageal reflux disease without esophagitis: Secondary | ICD-10-CM | POA: Diagnosis present

## 2017-07-20 HISTORY — PX: LAPAROSCOPIC GASTRIC SLEEVE RESECTION: SHX5895

## 2017-07-20 LAB — CBC
HEMATOCRIT: 45.8 % (ref 36.0–46.0)
Hemoglobin: 15.5 g/dL — ABNORMAL HIGH (ref 12.0–15.0)
MCH: 28.1 pg (ref 26.0–34.0)
MCHC: 33.8 g/dL (ref 30.0–36.0)
MCV: 83.1 fL (ref 78.0–100.0)
PLATELETS: 163 10*3/uL (ref 150–400)
RBC: 5.51 MIL/uL — ABNORMAL HIGH (ref 3.87–5.11)
RDW: 13.5 % (ref 11.5–15.5)
WBC: 7.5 10*3/uL (ref 4.0–10.5)

## 2017-07-20 LAB — CREATININE, SERUM
Creatinine, Ser: 0.91 mg/dL (ref 0.44–1.00)
GFR calc Af Amer: >60 mL/min (ref >60)
GFR calc non Af Amer: >60 mL/min (ref >60)

## 2017-07-20 SURGERY — GASTRECTOMY, SLEEVE, LAPAROSCOPIC
Anesthesia: General

## 2017-07-20 MED ORDER — ONDANSETRON HCL 4 MG/2ML IJ SOLN
INTRAMUSCULAR | Status: AC
  Filled 2017-07-20: qty 2

## 2017-07-20 MED ORDER — BUPIVACAINE HCL (PF) 0.25 % IJ SOLN
INTRAMUSCULAR | Status: AC
  Filled 2017-07-20: qty 30

## 2017-07-20 MED ORDER — CEFOTETAN DISODIUM-DEXTROSE 2-2.08 GM-% IV SOLR
2.0000 g | INTRAVENOUS | Status: AC
  Administered 2017-07-20: 2 g via INTRAVENOUS

## 2017-07-20 MED ORDER — BUPIVACAINE HCL 0.25 % IJ SOLN
INTRAMUSCULAR | Status: DC | PRN
  Administered 2017-07-20: 30 mL

## 2017-07-20 MED ORDER — APREPITANT 40 MG PO CAPS
40.0000 mg | ORAL_CAPSULE | ORAL | Status: AC
  Administered 2017-07-20: 40 mg via ORAL
  Filled 2017-07-20: qty 1

## 2017-07-20 MED ORDER — ROCURONIUM BROMIDE 50 MG/5ML IV SOSY
PREFILLED_SYRINGE | INTRAVENOUS | Status: AC
  Filled 2017-07-20: qty 5

## 2017-07-20 MED ORDER — DEXAMETHASONE SODIUM PHOSPHATE 4 MG/ML IJ SOLN
INTRAMUSCULAR | Status: DC | PRN
  Administered 2017-07-20: 10 mg via INTRAVENOUS

## 2017-07-20 MED ORDER — LIDOCAINE HCL (CARDIAC) 20 MG/ML IV SOLN
INTRAVENOUS | Status: DC | PRN
  Administered 2017-07-20: 50 mg via INTRAVENOUS

## 2017-07-20 MED ORDER — HEPARIN SODIUM (PORCINE) 5000 UNIT/ML IJ SOLN
5000.0000 [IU] | INTRAMUSCULAR | Status: AC
  Administered 2017-07-20: 5000 [IU] via SUBCUTANEOUS
  Filled 2017-07-20: qty 1

## 2017-07-20 MED ORDER — SUCCINYLCHOLINE CHLORIDE 20 MG/ML IJ SOLN
INTRAMUSCULAR | Status: DC | PRN
  Administered 2017-07-20: 140 mg via INTRAVENOUS

## 2017-07-20 MED ORDER — LIDOCAINE 2% (20 MG/ML) 5 ML SYRINGE
INTRAMUSCULAR | Status: AC
  Filled 2017-07-20: qty 10

## 2017-07-20 MED ORDER — PROPOFOL 10 MG/ML IV BOLUS
INTRAVENOUS | Status: DC | PRN
  Administered 2017-07-20: 200 mg via INTRAVENOUS

## 2017-07-20 MED ORDER — FENTANYL CITRATE (PF) 100 MCG/2ML IJ SOLN
INTRAMUSCULAR | Status: DC | PRN
Start: 2017-07-20 — End: 2017-07-20
  Administered 2017-07-20 (×2): 50 ug via INTRAVENOUS
  Administered 2017-07-20: 100 ug via INTRAVENOUS

## 2017-07-20 MED ORDER — DEXAMETHASONE SODIUM PHOSPHATE 10 MG/ML IJ SOLN
INTRAMUSCULAR | Status: AC
  Filled 2017-07-20: qty 1

## 2017-07-20 MED ORDER — GABAPENTIN 300 MG PO CAPS
300.0000 mg | ORAL_CAPSULE | ORAL | Status: AC
  Administered 2017-07-20: 300 mg via ORAL
  Filled 2017-07-20: qty 1

## 2017-07-20 MED ORDER — PROPOFOL 10 MG/ML IV BOLUS
INTRAVENOUS | Status: AC
  Filled 2017-07-20: qty 20

## 2017-07-20 MED ORDER — SUGAMMADEX SODIUM 200 MG/2ML IV SOLN
INTRAVENOUS | Status: DC | PRN
  Administered 2017-07-20: 200 mg via INTRAVENOUS

## 2017-07-20 MED ORDER — SODIUM CHLORIDE 0.9 % IV SOLN
INTRAVENOUS | Status: DC
  Administered 2017-07-20: 17:00:00 via INTRAVENOUS
  Administered 2017-07-21: 1000 mL via INTRAVENOUS

## 2017-07-20 MED ORDER — OXYCODONE HCL 5 MG/5ML PO SOLN
5.0000 mg | ORAL | Status: DC | PRN
  Administered 2017-07-21: 5 mg via ORAL
  Filled 2017-07-20: qty 5

## 2017-07-20 MED ORDER — PROMETHAZINE HCL 25 MG/ML IJ SOLN: 6.2500 mg | INTRAMUSCULAR | Status: DC | PRN

## 2017-07-20 MED ORDER — MIDAZOLAM HCL 5 MG/5ML IJ SOLN
INTRAMUSCULAR | Status: DC | PRN
  Administered 2017-07-20: 2 mg via INTRAVENOUS

## 2017-07-20 MED ORDER — LIDOCAINE 2% (20 MG/ML) 5 ML SYRINGE
INTRAMUSCULAR | Status: DC | PRN
  Administered 2017-07-20: 1.5 mg/kg/h via INTRAVENOUS

## 2017-07-20 MED ORDER — DIPHENHYDRAMINE HCL 50 MG/ML IJ SOLN
INTRAMUSCULAR | Status: DC | PRN
  Administered 2017-07-20: 12.5 mg via INTRAVENOUS

## 2017-07-20 MED ORDER — LACTATED RINGERS IV SOLN
INTRAVENOUS | Status: DC | PRN
  Administered 2017-07-20 (×2): via INTRAVENOUS

## 2017-07-20 MED ORDER — KETAMINE HCL 10 MG/ML IJ SOLN
INTRAMUSCULAR | Status: AC
  Filled 2017-07-20: qty 1

## 2017-07-20 MED ORDER — PREMIER PROTEIN SHAKE
2.0000 [oz_av] | ORAL | Status: DC
  Administered 2017-07-21 (×3): 2 [oz_av] via ORAL

## 2017-07-20 MED ORDER — ACETAMINOPHEN 500 MG PO TABS
1000.0000 mg | ORAL_TABLET | ORAL | Status: AC
  Administered 2017-07-20: 1000 mg via ORAL
  Filled 2017-07-20: qty 2

## 2017-07-20 MED ORDER — KETAMINE HCL 10 MG/ML IJ SOLN
INTRAMUSCULAR | Status: DC | PRN
  Administered 2017-07-20: 30 mg via INTRAVENOUS

## 2017-07-20 MED ORDER — DEXAMETHASONE SODIUM PHOSPHATE 10 MG/ML IJ SOLN
INTRAMUSCULAR | Status: AC
Start: 2017-07-20 — End: 2017-07-20
  Filled 2017-07-20: qty 1

## 2017-07-20 MED ORDER — CHLORHEXIDINE GLUCONATE 4 % EX LIQD: 60.0000 mL | Freq: Once | CUTANEOUS | Status: DC

## 2017-07-20 MED ORDER — PHENYLEPHRINE HCL 10 MG/ML IJ SOLN
INTRAVENOUS | Status: DC | PRN
  Administered 2017-07-20: 25 ug/min via INTRAVENOUS

## 2017-07-20 MED ORDER — PHENYLEPHRINE HCL 10 MG/ML IJ SOLN
INTRAMUSCULAR | Status: DC | PRN
  Administered 2017-07-20 (×3): 80 ug via INTRAVENOUS

## 2017-07-20 MED ORDER — DEXAMETHASONE SODIUM PHOSPHATE 10 MG/ML IJ SOLN
INTRAMUSCULAR | Status: DC | PRN
  Administered 2017-07-20: 10 mg via INTRAVENOUS

## 2017-07-20 MED ORDER — MIDAZOLAM HCL 2 MG/2ML IJ SOLN
INTRAMUSCULAR | Status: AC
  Filled 2017-07-20: qty 2

## 2017-07-20 MED ORDER — ACETAMINOPHEN 160 MG/5ML PO SOLN: 325.0000 mg | ORAL | Status: DC | PRN

## 2017-07-20 MED ORDER — FENTANYL CITRATE (PF) 250 MCG/5ML IJ SOLN
INTRAMUSCULAR | Status: AC
  Filled 2017-07-20: qty 5

## 2017-07-20 MED ORDER — ACETAMINOPHEN 325 MG PO TABS: 650.0000 mg | ORAL_TABLET | ORAL | Status: DC | PRN

## 2017-07-20 MED ORDER — SUCCINYLCHOLINE CHLORIDE 200 MG/10ML IV SOSY
PREFILLED_SYRINGE | INTRAVENOUS | Status: AC
  Filled 2017-07-20: qty 10

## 2017-07-20 MED ORDER — CEFOTETAN DISODIUM-DEXTROSE 2-2.08 GM-% IV SOLR
INTRAVENOUS | Status: AC
  Filled 2017-07-20: qty 50

## 2017-07-20 MED ORDER — ROCURONIUM BROMIDE 100 MG/10ML IV SOLN
INTRAVENOUS | Status: DC | PRN
  Administered 2017-07-20: 10 mg via INTRAVENOUS
  Administered 2017-07-20: 20 mg via INTRAVENOUS
  Administered 2017-07-20: 50 mg via INTRAVENOUS

## 2017-07-20 MED ORDER — SCOPOLAMINE 1 MG/3DAYS TD PT72: 1.0000 | MEDICATED_PATCH | TRANSDERMAL | Status: DC

## 2017-07-20 MED ORDER — ONDANSETRON HCL 4 MG/2ML IJ SOLN
INTRAMUSCULAR | Status: DC | PRN
  Administered 2017-07-20: 4 mg via INTRAVENOUS

## 2017-07-20 MED ORDER — ENOXAPARIN SODIUM 30 MG/0.3ML ~~LOC~~ SOLN
30.0000 mg | Freq: Two times a day (BID) | SUBCUTANEOUS | Status: DC
  Administered 2017-07-20 – 2017-07-21 (×2): 30 mg via SUBCUTANEOUS
  Filled 2017-07-20 (×2): qty 0.3

## 2017-07-20 MED ORDER — HYDROMORPHONE HCL-NACL 0.5-0.9 MG/ML-% IV SOSY
PREFILLED_SYRINGE | INTRAVENOUS | Status: AC
  Administered 2017-07-20: 0.25 mg via INTRAVENOUS
  Filled 2017-07-20: qty 1

## 2017-07-20 MED ORDER — TRAMADOL HCL 50 MG PO TABS: 50.0000 mg | ORAL_TABLET | Freq: Four times a day (QID) | ORAL | Status: DC | PRN

## 2017-07-20 MED ORDER — SIMETHICONE 80 MG PO CHEW: 80.0000 mg | CHEWABLE_TABLET | Freq: Four times a day (QID) | ORAL | Status: DC | PRN

## 2017-07-20 MED ORDER — PANTOPRAZOLE SODIUM 40 MG IV SOLR
40.0000 mg | Freq: Every day | INTRAVENOUS | Status: DC
  Administered 2017-07-20: 40 mg via INTRAVENOUS
  Filled 2017-07-20: qty 40

## 2017-07-20 MED ORDER — BUPIVACAINE LIPOSOME 1.3 % IJ SUSP
20.0000 mL | Freq: Once | INTRAMUSCULAR | Status: AC
Start: 2017-07-20 — End: 2017-07-20
  Administered 2017-07-20: 20 mL
  Filled 2017-07-20: qty 20

## 2017-07-20 MED ORDER — DEXAMETHASONE SODIUM PHOSPHATE 4 MG/ML IJ SOLN: 4.0000 mg | INTRAMUSCULAR | Status: DC

## 2017-07-20 MED ORDER — MORPHINE SULFATE (PF) 2 MG/ML IV SOLN
1.0000 mg | INTRAVENOUS | Status: DC | PRN
  Administered 2017-07-20 (×2): 2 mg via INTRAVENOUS
  Filled 2017-07-20 (×2): qty 1

## 2017-07-20 MED ORDER — SCOPOLAMINE 1 MG/3DAYS TD PT72SCOPOLAMINE 1 MG/3DAYS
1.0000 | MEDICATED_PATCH | TRANSDERMAL | Status: DC
Start: 2017-07-20 — End: 2017-07-20
  Administered 2017-07-20: 1.5 mg via TRANSDERMAL
  Filled 2017-07-20: qty 1

## 2017-07-20 MED ORDER — SUGAMMADEX SODIUM 200 MG/2ML IV SOLN
INTRAVENOUS | Status: AC
  Filled 2017-07-20: qty 2

## 2017-07-20 MED ORDER — HYDROMORPHONE HCL-NACL 0.5-0.9 MG/ML-% IV SOSY
0.2500 mg | PREFILLED_SYRINGE | INTRAVENOUS | Status: DC | PRN
  Administered 2017-07-20 (×2): 0.25 mg via INTRAVENOUS

## 2017-07-20 MED ORDER — LACTATED RINGERS IR SOLN
Status: DC | PRN
  Administered 2017-07-20: 1000 mL

## 2017-07-20 MED ORDER — DIPHENHYDRAMINE HCL 50 MG/ML IJ SOLN
INTRAMUSCULAR | Status: AC
  Filled 2017-07-20: qty 1

## 2017-07-20 MED ORDER — HYDRALAZINE HCL 20 MG/ML IJ SOLN
10.0000 mg | INTRAMUSCULAR | Status: DC | PRN
  Filled 2017-07-20: qty 0.5

## 2017-07-20 MED ORDER — ONDANSETRON HCL 4 MG/2ML IJ SOLN
4.0000 mg | INTRAMUSCULAR | Status: DC | PRN
Start: 2017-07-20 — End: 2017-07-21
  Administered 2017-07-20: 4 mg via INTRAVENOUS
  Filled 2017-07-20: qty 2

## 2017-07-20 SURGICAL SUPPLY — 58 items
APL SKNCLS STERI-STRIP NONHPOA (GAUZE/BANDAGES/DRESSINGS) ×1
APPLIER CLIP 5 13 M/L LIGAMAX5 (MISCELLANEOUS)
APPLIER CLIP ROT 13.4 12 LRG (CLIP)
APR CLP LRG 13.4X12 ROT 20 MLT (CLIP)
APR CLP MED LRG 5 ANG JAW (MISCELLANEOUS)
BAG LAPAROSCOPIC 12 15 PORT 16 (BASKET) ×1 IMPLANT
BAG RETRIEVAL 12/15 (BASKET) ×2
BANDAGE ADH SHEER 1  50/CT (GAUZE/BANDAGES/DRESSINGS) ×12 IMPLANT
BENZOIN TINCTURE PRP APPL 2/3 (GAUZE/BANDAGES/DRESSINGS) ×2 IMPLANT
BLADE SURG SZ11 CARB STEEL (BLADE) ×2 IMPLANT
CABLE HIGH FREQUENCY MONO STRZ (ELECTRODE) ×2 IMPLANT
CHLORAPREP W/TINT 26ML (MISCELLANEOUS) ×2 IMPLANT
CLIP APPLIE 5 13 M/L LIGAMAX5 (MISCELLANEOUS) IMPLANT
CLIP APPLIE ROT 13.4 12 LRG (CLIP) IMPLANT
COVER SURGICAL LIGHT HANDLE (MISCELLANEOUS) ×2 IMPLANT
ELECT REM PT RETURN 15FT ADLT (MISCELLANEOUS) ×2 IMPLANT
GAUZE SPONGE 4X4 12PLY STRL (GAUZE/BANDAGES/DRESSINGS) ×1 IMPLANT
GLOVE BIOGEL PI IND STRL 7.0 (GLOVE) ×1 IMPLANT
GLOVE BIOGEL PI INDICATOR 7.0 (GLOVE) ×1
GLOVE SURG SS PI 7.0 STRL IVOR (GLOVE) ×2 IMPLANT
GOWN STRL REUS W/TWL LRG LVL3 (GOWN DISPOSABLE) ×2 IMPLANT
GOWN STRL REUS W/TWL XL LVL3 (GOWN DISPOSABLE) ×6 IMPLANT
GRASPER SUT TROCAR 14GX15 (MISCELLANEOUS) ×2 IMPLANT
HANDLE STAPLE EGIA 4 XL (STAPLE) ×2 IMPLANT
HOVERMATT SINGLE USE (MISCELLANEOUS) ×2 IMPLANT
KIT BASIN OR (CUSTOM PROCEDURE TRAY) ×2 IMPLANT
MARKER SKIN DUAL TIP RULER LAB (MISCELLANEOUS) ×2 IMPLANT
NDL SPNL 22GX3.5 QUINCKE BK (NEEDLE) ×1 IMPLANT
NEEDLE SPNL 22GX3.5 QUINCKE BK (NEEDLE) ×2 IMPLANT
PACK CARDIOVASCULAR III (CUSTOM PROCEDURE TRAY) ×2 IMPLANT
RELOAD EGIA 45 MED/THCK PURPLE (STAPLE) IMPLANT
RELOAD EGIA 60 MED/THCK PURPLE (STAPLE) ×8 IMPLANT
RELOAD STAPLE 60 BLK XTHK ART (STAPLE) IMPLANT
RELOAD STAPLE 60 MED/THCK ART (STAPLE) IMPLANT
RELOAD TRI 2.0 60 XTHK VAS SUL (STAPLE) ×2 IMPLANT
RELOAD TRI 60 ART MED THCK BLK (STAPLE) IMPLANT
RELOAD TRI 60 ART MED THCK PUR (STAPLE) IMPLANT
SCISSORS LAP 5X45 EPIX DISP (ENDOMECHANICALS) ×1 IMPLANT
SET IRRIG TUBING LAPAROSCOPIC (IRRIGATION / IRRIGATOR) ×2 IMPLANT
SHEARS HARMONIC ACE PLUS 45CM (MISCELLANEOUS) ×2 IMPLANT
SLEEVE GASTRECTOMY 40FR VISIGI (MISCELLANEOUS) ×2 IMPLANT
SLEEVE XCEL OPT CAN 5 100 (ENDOMECHANICALS) ×4 IMPLANT
SOLUTION ANTI FOG 6CC (MISCELLANEOUS) ×2 IMPLANT
SPONGE LAP 18X18 X RAY DECT (DISPOSABLE) ×2 IMPLANT
STRIP CLOSURE SKIN 1/2X4 (GAUZE/BANDAGES/DRESSINGS) ×2 IMPLANT
SUT ETHIBOND 0 36 GRN (SUTURE) ×1 IMPLANT
SUT ETHILON 2 0 PS N (SUTURE) IMPLANT
SUT MNCRL AB 4-0 PS2 18 (SUTURE) ×2 IMPLANT
SUT VICRYL 0 TIES 12 18 (SUTURE) ×2 IMPLANT
SYR 20CC LL (SYRINGE) ×2 IMPLANT
SYR 50ML LL SCALE MARK (SYRINGE) ×2 IMPLANT
TOWEL OR 17X26 10 PK STRL BLUE (TOWEL DISPOSABLE) ×2 IMPLANT
TOWEL OR NON WOVEN STRL DISP B (DISPOSABLE) ×2 IMPLANT
TROCAR BLADELESS 15MM (ENDOMECHANICALS) ×2 IMPLANT
TROCAR BLADELESS OPT 5 100 (ENDOMECHANICALS) ×2 IMPLANT
TUBING CONNECTING 10 (TUBING) ×3 IMPLANT
TUBING ENDO SMARTCAP (MISCELLANEOUS) ×2 IMPLANT
TUBING INSUF HEATED (TUBING) ×2 IMPLANT

## 2017-07-20 NOTE — Anesthesia Postprocedure Evaluation (Signed)
Anesthesia Post Note  Patient: Krista Fowler  Procedure(s) Performed: Procedure(s) (LRB): LAPAROSCOPIC GASTRIC SLEEVE RESECTION, UPPER ENDO (N/A)     Patient location during evaluation: PACU Anesthesia Type: General Level of consciousness: sedated Pain management: pain level controlled Vital Signs Assessment: post-procedure vital signs reviewed and stable Respiratory status: spontaneous breathing and respiratory function stable Cardiovascular status: stable Anesthetic complications: no    Last Vitals:  Vitals:   07/20/17 1056 07/20/17 1100  BP:  (!) 150/81  Pulse: 74 67  Resp: 13 14  Temp:  36.6 C  SpO2: 99% 97%    Last Pain:  Vitals:   07/20/17 1100  TempSrc:   PainSc: Asleep                 Nemiah Bubar DANIEL

## 2017-07-20 NOTE — Transfer of Care (Signed)
Immediate Anesthesia Transfer of Care Note  Patient: Krista Fowler  Procedure(s) Performed: Procedure(s): LAPAROSCOPIC GASTRIC SLEEVE RESECTION, UPPER ENDO (N/A)  Patient Location: PACU  Anesthesia Type:General  Level of Consciousness: awake  Airway & Oxygen Therapy: Patient Spontanous Breathing and Patient connected to face mask oxygen  Post-op Assessment: Report given to RN and Post -op Vital signs reviewed and stable  Post vital signs: Reviewed and stable  Last Vitals:  Vitals:   07/20/17 0520  BP: 126/79  Pulse: 74  Resp: 16  Temp: 36.7 C  SpO2: 100%    Last Pain:  Vitals:   07/20/17 0520  TempSrc: Oral      Patients Stated Pain Goal: 3 (07/20/17 0549)  Complications: No apparent anesthesia complications

## 2017-07-20 NOTE — Discharge Instructions (Signed)
° ° ° °GASTRIC BYPASS/SLEEVE ° Home Care Instructions ° ° These instructions are to help you care for yourself when you go home. ° °Call: If you have any problems. °• Call [phone] and ask for the surgeon on call °• If you need immediate assistance come to the ER at [place]. Tell the ER staff you are a new post-op gastric bypass or gastric sleeve patient  °Signs and symptoms to report: • Severe  vomiting or nausea °o If you cannot handle clear liquids for longer than 1 day, call your surgeon °• Abdominal pain which does not get better after taking your pain medication °• Fever greater than 100.4°  F and chills °• Heart rate over 100 beats a minute °• Trouble breathing °• Chest pain °• Redness,  swelling, drainage, or foul odor at incision (surgical) sites °• If your incisions open or pull apart °• Swelling or pain in calf (lower leg) °• Diarrhea (Loose bowel movements that happen often), frequent watery, uncontrolled bowel movements °• Constipation, (no bowel movements for 3 days) if this happens: °o Take Milk of Magnesia, 2 tablespoons by mouth, 3 times a day for 2 days if needed °o Stop taking Milk of Magnesia once you have had a bowel movement °o Call your doctor if constipation continues °Or °o Take Miralax  (instead of Milk of Magnesia) following the label instructions °o Stop taking Miralax once you have had a bowel movement °o Call your doctor if constipation continues °• Anything you think is “abnormal for you” °  °Normal side effects after surgery: • Unable to sleep at night or unable to concentrate °• Irritability °• Being tearful (crying) or depressed ° °These are common complaints, possibly related to your anesthesia, stress of surgery, and change in lifestyle, that usually go away a few weeks after surgery. If these feelings continue, call your medical doctor.  °Wound Care: You may have surgical glue, steri-strips, or staples over your incisions after surgery °• Surgical glue: Looks like clear  film over your incisions and will wear off a little at a time °• Steri-strips: Adhesive strips of tape over your incisions. You may notice a yellowish color on skin under the steri-strips. This is used to make the steri-strips stick better. Do not pull the steri-strips off - let them fall off °• Staples: Staples may be removed before you leave the hospital °o If you go home with staples, call [facility] for an appointment with your surgeon’s nurse to have staples removed 10 days after surgery, [phone] °• Showering: You may shower two (2) days after your surgery unless your surgeon tells you differently °o Wash gently around incisions with warm soapy water, rinse well, and gently pat dry °o If you have a drain (tube from your incision), you may need someone to hold this while you shower °o No tub baths until staples are removed and incisions are healed °  °Medications: • Medications should be liquid or crushed if larger than the size of a dime °• Extended release pills (medication that releases a little bit at a time through the  day) should not be crushed °• Depending on the size and number of medications you take, you may need to space (take a few throughout the day)/change the time you take your medications so that you do not over-fill your pouch (smaller stomach) °• Make sure you follow-up with you primary care physician to make medication changes needed during rapid weight loss and life -style changes °•   If you have diabetes, follow up with your doctor that orders your diabetes medication(s) within one week after surgery and check your blood sugar regularly ° °• Do not drive while taking narcotics (pain medications) ° °• Do not take acetaminophen (Tylenol) and Roxicet or Lortab Elixir at the same time since these pain medications contain acetaminophen °  °Diet:  °First 2 Weeks You will see the nutritionist about two (2) weeks after your surgery. The nutritionist will increase the types of  foods you can eat if you are handling liquids well: °• If you have severe vomiting or nausea and cannot handle clear liquids lasting longer than 1 day call your surgeon °Protein Shake °• Drink at least 2 ounces of shake 5-6 times per day °• Each serving of protein shakes (usually 8-12 ounces) should have a minimum of: °o 15 grams of protein °o And no more than 5 grams of carbohydrate °• Goal for protein each day: °o Men = 80 grams per day °o Women = 60 grams per day °  ° • Protein powder may be added to fluids such as non-fat milk or Lactaid milk or Soy milk (limit to 35 grams added protein powder per serving) ° °Hydration °• Slowly increase the amount of water and other clear liquids as tolerated (See Acceptable Fluids) °• Slowly increase the amount of protein shake as tolerated °• Sip fluids slowly and throughout the day °• May use sugar substitutes in small amounts (no more than 6-8 packets per day; i.e. Splenda) ° °Fluid Goal °• The first goal is to drink at least 8 ounces of protein shake/drink per day (or as directed by the nutritionist); some examples of protein shakes are Syntrax Nectar, Adkins Advantage, EAS Edge HP, and Unjury. - See handout from pre-op Bariatric Education Class: °o Slowly increase the amount of protein shake you drink as tolerated °o You may find it easier to slowly sip shakes throughout the day °o It is important to get your proteins in first °• Your fluid goal is to drink 64-100 ounces of fluid daily °o It may take a few weeks to build up to this  °• 32 oz. (or more) should be clear liquids °And °• 32 oz. (or more) should be full liquids (see below for examples) °• Liquids should not contain sugar, caffeine, or carbonation ° °Clear Liquids: °• Water of Sugar-free flavored water (i.e. Fruit H²O, Propel) °• Decaffeinated coffee or tea (sugar-free) °• Crystal lite, Wyler’s Lite, Minute Maid Lite °• Sugar-free Jell-O °• Bouillon or broth °• Sugar-free Popsicle:    - Less than 20 calories  each; Limit 1 per day ° °Full Liquids: °                  Protein Shakes/Drinks + 2 choices per day of other full liquids °• Full liquids must be: °o No More Than 12 grams of Carbs per serving °o No More Than 3 grams of Fat per serving °• Strained low-fat cream soup °• Non-Fat milk °• Fat-free Lactaid Milk °• Sugar-free yogurt (Dannon Lite & Fit, Greek yogurt) ° °  °Vitamins and Minerals • Start 1 day after surgery unless otherwise directed by your surgeon °• 2 Chewable Bariatric Multivitamin / Multimineral Supplement with iron °• Chewable Calcium Citrate with Vitamin D-3 °(Example: 3 Chewable Calcium  Plus 600 with Vitamin D-3) °o Take 500 mg three (3) times a day for a total of 1500 mg each day °o Do not take all 3 doses of calcium   at one time as it may cause constipation, and you can only absorb 500 mg at a time °o Do not mix multivitamins containing iron with calcium supplements;  take 2 hours apart °• Menstruating women and those at risk for anemia ( a blood disease that causes weakness) may need extra iron °o Talk to your doctor to see if you need more iron °• If you need extra iron: Total daily Iron recommendation (including Vitamins) is 50 to 100 mg Iron/day °• Do not stop taking or change any vitamins or minerals until you talk to your nutritionist or surgeon °• Your nutritionist and/or surgeon must approve all vitamin and mineral supplements °  °Activity and Exercise: It is important to continue walking at home. Limit your physical activity as instructed by your doctor. During this time, use these guidelines: °• Do not lift anything greater than ten  (10) pounds for at least two (2) weeks °• Do not go back to work or drive until your surgeon says you can °• You may have sex when you feel comfortable °o It is VERY important for female patients to use a reliable birth control method; fertility often increase after surgery °o Do not get pregnant for at least 18 months °• Start exercising as soon as your  doctor tells you that you can °o Make sure your doctor approves any physical activity °• Start with a simple walking program °• Walk 5-15 minutes each day, 7 days per week °• Slowly increase until you are walking 30-45 minutes per day °• Consider joining our BELT program. [phone] or email [email or web address] °  °Special Instructions Things to remember: °• Use your CPAP when sleeping if this applies to you °• Consider buying a medical alert bracelet that says you had lap-band surgery °  °  You will likely have your first fill (fluid added to your band) 6 - 8 weeks after surgery °• [facility] has a free Bariatric Surgery Support Group that meets monthly, the 3rd Thursday, 6pm. [employer] [facility] Classrooms. You can see classes online at [email or web address] °• It is very important to keep all follow up appointments with your surgeon, nutritionist, primary care physician, and behavioral health practitioner °o After the first year, please follow up with your bariatric surgeon and nutritionist at least once a year in order to maintain best weight loss results °      °             [facility]:  [phone] ° °             [facility]: [phone] ° °             Bariatric Nurse Coordinator: [phone]  °Gastric Bypass/Sleeve Home Care Instructions  Rev. 01/2013    ° °                                                    Reviewed and Endorsed °                                                   by [facility] Patient Education Committee, Jan, 2014 ° ° ° ° ° ° ° ° ° °

## 2017-07-20 NOTE — Anesthesia Procedure Notes (Signed)
Procedure Name: Intubation Date/Time: 07/20/2017 7:35 AM Performed by: [place] Lilia Letterman C Pre-anesthesia Checklist: Patient identified, Emergency Drugs available, Suction available and Patient being monitored Patient Re-evaluated:Patient Re-evaluated prior to induction Oxygen Delivery Method: Circle system utilized Preoxygenation: Pre-oxygenation with 100% oxygen Induction Type: IV induction Ventilation: Mask ventilation without difficulty Laryngoscope Size: Mac and 3 Grade View: Grade I Tube type: Oral Tube size: 7.0 mm Number of attempts: 1 Airway Equipment and Method: Stylet and Oral airway Placement Confirmation: ETT inserted through vocal cords under direct vision,  positive ETCO2 and breath sounds checked- equal and bilateral Secured at: 21 cm Tube secured with: Tape Dental Injury: Teeth and Oropharynx as per pre-operative assessment

## 2017-07-20 NOTE — H&P (Signed)
Krista Fowler is an 46 y.o. female.   Chief Complaint: obesity HPI: 46 yo female with long history of obesity presents for sleeve gastrectomy  Past Medical History:  Diagnosis Date  . GERD (gastroesophageal reflux disease)   . History of hiatal hernia     Past Surgical History:  Procedure Laterality Date  . ABDOMINAL HYSTERECTOMY     partial  . TUBAL LIGATION      Family History  Problem Relation Age of Onset  . Hypertension Mother   . Arthritis Mother   . Diabetes Father   . Hypertension Father   . Hypertension Brother   . Leukemia Maternal Grandmother   . Hypertension Maternal Grandmother   . Diabetes Paternal Grandmother   . Cancer Other   . Stroke Other   . Heart disease Other    Social History:  reports that she has never smoked. She has never used smokeless tobacco. She reports that she does not drink alcohol or use drugs.  Allergies:  Allergies  Allergen Reactions  . Chlorhexidine Itching    Medications Prior to Admission  Medication Sig Dispense Refill  . acetaminophen (TYLENOL) 500 MG tablet Take 1,000 mg by mouth every 6 (six) hours as needed for mild pain or moderate pain.    . cyclobenzaprine (FLEXERIL) 10 MG tablet Take 1 tablet (10 mg total) by mouth 3 (three) times daily. (Patient not taking: Reported on 03/27/2017) 20 tablet 0  . dexamethasone (DECADRON) 4 MG tablet Take 1 tablet (4 mg total) by mouth 2 (two) times daily with a meal. (Patient not taking: Reported on 03/27/2017) 12 tablet 0  . diclofenac (VOLTAREN) 75 MG EC tablet Take 1 tablet (75 mg total) by mouth 2 (two) times daily. (Patient not taking: Reported on 03/27/2017) 14 tablet 0    No results found for this or any previous visit (from the past 48 hour(s)). No results found.  Review of Systems  Constitutional: Negative for chills and fever.  HENT: Negative for hearing loss.   Eyes: Negative for blurred vision and double vision.  Respiratory: Negative for cough and hemoptysis.    Cardiovascular: Negative for chest pain and palpitations.  Gastrointestinal: Negative for abdominal pain, nausea and vomiting.  Genitourinary: Negative for dysuria and urgency.  Musculoskeletal: Negative for myalgias and neck pain.  Skin: Negative for itching and rash.  Neurological: Negative for dizziness, tingling and headaches.  Endo/Heme/Allergies: Does not bruise/bleed easily.  Psychiatric/Behavioral: Negative for depression and suicidal ideas.    Blood pressure 126/79, pulse 74, temperature 98.1 F (36.7 C), temperature source Oral, resp. rate 16, height 5\' 5"  (1.651 m), weight 135.4 kg (298 lb 8 oz), SpO2 100 %. Physical Exam  Vitals reviewed. Constitutional: She is oriented to person, place, and time. She appears well-developed and well-nourished.  HENT:  Head: Normocephalic and atraumatic.  Eyes: Pupils are equal, round, and reactive to light. Conjunctivae and EOM are normal.  Neck: Normal range of motion. Neck supple.  Cardiovascular: Normal rate and regular rhythm.   Respiratory: Effort normal and breath sounds normal.  GI: Soft. Bowel sounds are normal. She exhibits no distension. There is no tenderness.  Musculoskeletal: Normal range of motion.  Neurological: She is alert and oriented to person, place, and time.  Skin: Skin is warm and dry.  Psychiatric: She has a normal mood and affect. Her behavior is normal.     Assessment/Plan 46 yo female with morbid obesity and hiatal hernia -lap sleeve with hiatal hernia repair -ERAS protocol admission  Rodman Pickle, MD 07/20/2017, 7:18 AM

## 2017-07-20 NOTE — Op Note (Addendum)
Preop Diagnosis: Obesity Class III  Postop Diagnosis: same  Procedure performed: laparoscopic Sleeve Gastrectomy  Assitant: Gaynelle Adu  Indications:  The patient is a 46 y.o. year-old morbidly obese female who has been followed in the Bariatric Clinic as an outpatient. This patient was diagnosed with morbid obesity with a BMI of Body mass index is 49.67 kg/m. and significant co-morbidities including osteoarthritis.  The patient was counseled extensively in the Bariatric Outpatient Clinic and after a thorough explanation of the risks and benefits of surgery (including death from complications, bowel leak, infection such as peritonitis and/or sepsis, internal hernia, bleeding, need for blood transfusion, bowel obstruction, organ failure, pulmonary embolus, deep venous thrombosis, wound infection, incisional hernia, skin breakdown, and others entailed on the consent form) and after a compliant diet and exercise program, the patient was scheduled for an elective laparoscopic sleeve gastrectomy.  Description of Operation:  Following informed consent, the patient was taken to the operating room and placed on the operating table in the supine position.  She had previously received prophylactic antibiotics and subcutaneous heparin for DVT prophylaxis in the pre-op holding area.  After induction of general endotracheal anesthesia by the anesthesiologist, the patient underwent placement of sequential compression devices, Foley catheter and an oro-gastric tube.  A timeout was confirmed by the surgery and anesthesia teams.  The patient was adequately padded at all pressure points and placed on a footboard to prevent slippage from the OR table during extremes of position during surgery.  She underwent a routine sterile prep and drape of her entire abdomen.    Next, A transverse incision was made under the left subcostal area and a 5mm optical viewing trocar was introduced into the peritoneal cavity.  Pneumoperitoneum was applied with a high flow and low pressure. A laparoscope was inserted to confirm placement. A extraperitoneal block was then placed at the lateral abdominal wall using exparel diluted with marcaine. 5 additional trocars were placed: 1 5mm trocar to the left of the midline. 1 additional 5mm trocar in the left lateral area, 1 12mm trocar in the right mid abdomen, and 1 5mm trocar in the right subcostal area.  The UGI showed a small hiatal hernia. Therefore, the pars flaccida was incised with harmonic scalpel. The stomach was reduced but on dissection of the posterior crus there was a visible hernia with small sac. The sac was dissected free and 1 0 ethibond sutures placed in interrupted fashion.   The fat pad at the GE junction was incised and the gastrodiaphragmatic ligament was divided using the Harmonic scalpel. Next, a hole was created through the lesser omentum along the greater curve of the stomach to enter the lesser sac. The vessels along the greater omentum were  Then ligated and divided using the Harmonic scalpel moving towards the spleen and then short gastric vessels were ligated and divided in the same fashion to fully mobilize the fundus. The left crus was identified to ensure completion of the dissection. Next the antrum was measured and dissection continued inferiorly along the greater curve towards the pylorus and stopped 6cm from the pylorus.   A 40Fr ViSiGi dilator was placed into the esophgaus and along the lesser curve of the stomach and placed on suction. 1 non-reinforced 60mm 4-62mm tristapler(s) followed by 5  60mm 3-31mm tristaplers were used to make the resection along the antrum being sure to stay well away from the angularis by angling the jaws of the stapler towards the greater curve and later completing the resection staying  along the ViSiGi and ensuring the fundus was not retained by appropriately retracting it lateral. Air was inserted through the ViSiGi to  perform a leak test showing no bubbles and a neutral lie of the stomach.  The assistant then went and attempted to perform an upper endoscopy however, due to pharynx anatomy, the scope was not able to be passed. The specimen was then placed in an endocatch bag and removed by the 44mm port. The fascia of the 30mm port was closed with a 0 vicryl by suture passer. Hemostasis was ensured. Pneumoperitoneum was evacuated, all ports were removed and all incisions closed with 4-0 monocryl suture in subcuticular fashion. Steristrips and bandaids were put in place for dressing. The patient awoke from anesthesia and was brought to pacu in stable condition. All counts were correct.  Estimated blood loss: <68ml  Specimens:  Sleeve gastrectomy  Local Anesthesia: 50 ml Exparel:0.5% Marcaine mix  Post-Op Plan:       Pain Management: PO, prn      Antibiotics: Prophylactic      Anticoagulation: Prophylactic, Starting now      Post Op Studies/Consults: Not applicable      Intended Discharge: within 48h      Intended Outpatient Follow-Up: Two Week      Intended Outpatient Studies: Not Applicable      Other: Not Applicable   De Blanch Lutisha Knoche

## 2017-07-20 NOTE — Progress Notes (Signed)
Discussed plan of care with patient.  Emphasized frequent ambulation, IS, and diet progression.  No questions at this time.

## 2017-07-21 LAB — CBC WITH DIFFERENTIAL/PLATELET
BASOS ABS: 0 10*3/uL (ref 0.0–0.1)
BASOS PCT: 0 %
EOS ABS: 0 10*3/uL (ref 0.0–0.7)
EOS PCT: 0 %
HEMATOCRIT: 37.2 % (ref 36.0–46.0)
Hemoglobin: 12.6 g/dL (ref 12.0–15.0)
Lymphocytes Relative: 15 %
Lymphs Abs: 1.5 10*3/uL (ref 0.7–4.0)
MCH: 28.3 pg (ref 26.0–34.0)
MCHC: 33.9 g/dL (ref 30.0–36.0)
MCV: 83.6 fL (ref 78.0–100.0)
MONO ABS: 0.3 10*3/uL (ref 0.1–1.0)
MONOS PCT: 3 %
Neutro Abs: 8.2 10*3/uL — ABNORMAL HIGH (ref 1.7–7.7)
Neutrophils Relative %: 82 %
PLATELETS: 283 10*3/uL (ref 150–400)
RBC: 4.45 MIL/uL (ref 3.87–5.11)
RDW: 13.4 % (ref 11.5–15.5)
WBC: 10 10*3/uL (ref 4.0–10.5)

## 2017-07-21 LAB — COMPREHENSIVE METABOLIC PANEL
ALBUMIN: 3.3 g/dL — AB (ref 3.5–5.0)
ALT: 49 U/L (ref 14–54)
ANION GAP: 10 (ref 5–15)
AST: 66 U/L — AB (ref 15–41)
Alkaline Phosphatase: 60 U/L (ref 38–126)
BILIRUBIN TOTAL: 0.2 mg/dL — AB (ref 0.3–1.2)
BUN: 11 mg/dL (ref 6–20)
CHLORIDE: 104 mmol/L (ref 101–111)
CO2: 23 mmol/L (ref 22–32)
Calcium: 9.1 mg/dL (ref 8.9–10.3)
Creatinine, Ser: 0.87 mg/dL (ref 0.44–1.00)
GFR calc Af Amer: >60 mL/min (ref >60)
GFR calc non Af Amer: >60 mL/min (ref >60)
GLUCOSE: 103 mg/dL — AB (ref 65–99)
POTASSIUM: 4.7 mmol/L (ref 3.5–5.1)
Sodium: 137 mmol/L (ref 135–145)
TOTAL PROTEIN: 7.1 g/dL (ref 6.5–8.1)

## 2017-07-21 NOTE — Plan of Care (Signed)
Problem: Food- and Nutrition-Related Knowledge Deficit (NB-1.1) Goal: Nutrition education Formal process to instruct or train a patient/client in a skill or to impart knowledge to help patients/clients voluntarily manage or modify food choices and eating behavior to maintain or improve health. Outcome: Completed/Met Date Met: 07/21/17 Nutrition Education Note  Received consult for diet education per DROP protocol.   Discussed 2 week post op diet with pt. Emphasized that liquids must be non carbonated, non caffeinated, and sugar free. Fluid goals discussed. Pt to follow up with outpatient bariatric RD for further diet progression after 2 weeks. Multivitamins and minerals also reviewed. Teach back method used, pt expressed understanding, expect good compliance.   Diet: First 2 Weeks  You will see the nutritionist about two (2) weeks after your surgery. The nutritionist will increase the types of foods you can eat if you are handling liquids well:  If you have severe vomiting or nausea and cannot handle clear liquids lasting longer than 1 day, call your surgeon  Protein Shake  Drink at least 2 ounces of shake 5-6 times per day  Each serving of protein shakes (usually 8 - 12 ounces) should have a minimum of:  15 grams of protein  And no more than 5 grams of carbohydrate  Goal for protein each day:  Men = 80 grams per day  Women = 60 grams per day  Protein powder may be added to fluids such as non-fat milk or Lactaid milk or Soy milk (limit to 35 grams added protein powder per serving)   Hydration  Slowly increase the amount of water and other clear liquids as tolerated (See Acceptable Fluids)  Slowly increase the amount of protein shake as tolerated  Sip fluids slowly and throughout the day  May use sugar substitutes in small amounts (no more than 6 - 8 packets per day; i.e. Splenda)   Fluid Goal  The first goal is to drink at least 8 ounces of protein shake/drink per day (or as directed  by the nutritionist); some examples of protein shakes are Premier Protein, Johnson [employer] Johnson, [employer]R Corporation, EAS Edge HP, and Unjury. See handout from pre-op Bariatric Education Class:  Slowly increase the amount of protein shake you drink as tolerated  You may find it easier to slowly sip shakes throughout the day  It is important to get your proteins in first  Your fluid goal is to drink 64 - 100 ounces of fluid daily  It may take a few weeks to build up to this  32 oz (or more) should be clear liquids  And  32 oz (or more) should be full liquids (see below for examples)  Liquids should not contain sugar, caffeine, or carbonation   Clear Liquids:  Water or Sugar-free flavored water (i.e. Fruit H2O, Propel)  Decaffeinated coffee or tea (sugar-free)  Crystal Lite, Wyler's Lite, Minute Maid Lite  Sugar-free Jell-O  Bouillon or broth  Sugar-free Popsicle: *Less than 20 calories each; Limit 1 per day   Full Liquids:  Protein Shakes/Drinks + 2 choices per day of other full liquids  Full liquids must be:  No More Than 12 grams of Carbs per serving  No More Than 3 grams of Fat per serving  Strained low-fat cream soup  Non-Fat milk  Fat-free Lactaid Milk  Sugar-free yogurt (Dannon Lite & Fit, [place] yogurt, Oikos Zero)   Mariana Single RD, LDN Clinical Nutrition Pager # - [phone]

## 2017-07-21 NOTE — Discharge Summary (Signed)
Physician Discharge Summary  Krista Fowler:[id number] DOB: [birth year] DOA: 07/20/2017  PCP: Dettinger, Elige Radon, MD  Admit date: 07/20/2017 Discharge date: 07/21/2017  Recommendations for Outpatient Follow-up:  1.  (include homehealth, outpatient follow-up instructions, specific recommendations for PCP to follow-up on, etc.)  Follow-up Information    Krista Fowler, De Blanch, MD. Go on 08/05/2017.   Specialty:  General Surgery Why:  at 300 Contact information: [address] [place] [address] [place] [address] [phone]        Krista Fowler, De Blanch, MD Follow up.   Specialty:  General Surgery Contact information: [address] [place] [address] [place] [address] [phone]          Discharge Diagnoses:  Active Problems:   Morbid obesity [facility])   Surgical Procedure: Laparoscopic Sleeve Gastrectomy, upper endoscopy  Discharge Condition: Good Disposition: Home  Diet recommendation: Postoperative sleeve gastrectomy diet (liquids only)  Filed Weights   07/20/17 0520 07/21/17 0756  Weight: 135.4 kg (298 lb 8 oz) (!) 141.3 kg (311 lb 9.6 oz)     Hospital Course:  The patient was admitted after undergoing laparoscopic sleeve gastrectomy. POD 0 she ambulated well. POD 1 she was started on the water diet protocol and tolerated 300 ml in the first shift. Once meeting the water amount she was advanced to bariatric protein shakes which they tolerated and were discharged home POD 1.  Treatments: surgery: laparoscopic sleeve gastrectomy  Discharge Instructions  Discharge Instructions    AMB Referral to [facility] Care Management    Complete by:  As directed    Please assign UMR member for post discharge call. Currently at [facility]. Likely dc today 07/21/17. Thanks. [name], MSN-Ed, RN,BSN-[facility]N [facility] [phone]   Reason for consult:  Please assign UMR member for post discharge call   Expected date of contact:  1-3 days (reserved for hospital  discharges)   Ambulate hourly while awake    Complete by:  As directed    Call MD for:  difficulty breathing, headache or visual disturbances    Complete by:  As directed    Call MD for:  persistant dizziness or light-headedness    Complete by:  As directed    Call MD for:  persistant nausea and vomiting    Complete by:  As directed    Call MD for:  redness, tenderness, or signs of infection (pain, swelling, redness, odor or green/yellow discharge around incision site)    Complete by:  As directed    Call MD for:  severe uncontrolled pain    Complete by:  As directed    Call MD for:  temperature >101 F    Complete by:  As directed    Diet bariatric full liquid    Complete by:  As directed    Discharge wound care:    Complete by:  As directed    Remove Bandaids tomorrow, ok to shower tomorrow. Steristrips may fall off in 1-3 weeks.   Incentive spirometry    Complete by:  As directed    Perform hourly while awake     Allergies as of 07/21/2017      Reactions   Chlorhexidine Itching      Medication List    STOP taking these medications   cyclobenzaprine 10 MG tablet Commonly known as:  FLEXERIL   dexamethasone 4 MG tablet Commonly known as:  DECADRON   diclofenac 75 MG EC tablet Commonly known as:  VOLTAREN     TAKE  these medications   acetaminophen 500 MG tablet Commonly known as:  TYLENOL Take 1,000 mg by mouth every 6 (six) hours as needed for mild pain or moderate pain.      Follow-up Information    Krista Fowler, De Blanch, MD. Go on 08/05/2017.   Specialty:  General Surgery Why:  at 300 Contact information: [address] [place] [address] [place] [address] [phone]        Krista Fowler, De Blanch, MD Follow up.   Specialty:  General Surgery Contact information: [address] [place] [address] [place] [address] [phone]            The results of significant diagnostics from this hospitalization (including imaging, microbiology, ancillary and  laboratory) are listed below for reference.    Significant Diagnostic Studies: No results found.  Labs: Basic Metabolic Panel:  Recent Labs Lab 07/20/17 1232 07/21/17 0656  NA  --  137  K  --  4.7  CL  --  104  CO2  --  23  GLUCOSE  --  103*  BUN  --  11  CREATININE 0.91 0.87  CALCIUM  --  9.1   Liver Function Tests:  Recent Labs Lab 07/21/17 0656  AST 66*  ALT 49  ALKPHOS 60  BILITOT 0.2*  PROT 7.1  ALBUMIN 3.3*    CBC:  Recent Labs Lab 07/20/17 1232 07/21/17 0656  WBC 7.5 10.0  NEUTROABS  --  8.2*  HGB 15.5* 12.6  HCT 45.8 37.2  MCV 83.1 83.6  PLT 163 283    CBG: No results for input(s): GLUCAP in the last 168 hours.  Active Problems:   Morbid obesity (HCC)   Time coordinating discharge: <31min

## 2017-07-21 NOTE — Consult Note (Addendum)
   [facility] CM Inpatient Consult   07/21/2017  [name] 1971-09-23 [id number]    Came to bedside to speak with [name] on behalf of [facility] Care Management/Link to Wellness program for [employer] employees/dependents with [employer].  Discussed Link to Wellness program. Denies any Link to Wellness needs.   Provided [employer]/FMLA telephone number per Ms. Tresner' request.   Confirmed best contact number for her for post discharge call as [phone].  Provided Link to [employer], contact information, and 24-hr nurse line magnet.   Appreciative of visit.    [name], MSN-Ed, RN,BSN [facility] Liaison [phone]

## 2017-07-21 NOTE — Progress Notes (Signed)
   Progress Note: Metabolic and Bariatric Surgery Service   Chief Complaint/Subjective: Pain much improved, tolerating water, ambulating in the hallways this morning  Objective: Vital signs in last 24 hours: Temp:  [96.7 F (35.9 C)-97.8 F (36.6 C)] 97.3 F (36.3 C) (08/21 0508) Pulse Rate:  [44-78] 52 (08/21 0508) Resp:  [13-18] 18 (08/21 0508) BP: (120-150)/(65-86) 120/66 (08/21 0508) SpO2:  [93 %-100 %] 97 % (08/21 0508) Last BM Date: 07/19/17  Intake/Output from previous day: 08/20 0701 - 08/21 0700 In: 3103.8 [P.O.:120; I.V.:2983.8] Out: 1995 [Urine:1975; Blood:20] Intake/Output this shift: No intake/output data recorded.  Lungs: CTAB  Cardiovascular: bradycardic  Abd: soft, ATTP, incisions c/d/i  Extremities: no edema  Neuro: AOx4  Lab Results: CBC   Recent Labs  07/20/17 1232  WBC 7.5  HGB 15.5*  HCT 45.8  PLT 163   BMET  Recent Labs  07/20/17 1232  CREATININE 0.91   PT/INR No results for input(s): LABPROT, INR in the last 72 hours. ABG No results for input(s): PHART, HCO3 in the last 72 hours.  Invalid input(s): PCO2, PO2  Studies/Results:  Anti-infectives: Anti-infectives    Start     Dose/Rate Route Frequency Ordered Stop   07/20/17 0644  cefoTEtan in Dextrose 5% (CEFOTAN) 2-2.08 GM-% IVPB    Comments:  [place], Judeth Cornfield  : cabinet override      07/20/17 0644 07/20/17 0737   07/20/17 0519  cefoTEtan in Dextrose 5% (CEFOTAN) IVPB 2 g     2 g Intravenous On call to O.R. 07/20/17 0520 07/20/17 0752      Medications: Scheduled Meds: . enoxaparin (LOVENOX) injection  30 mg Subcutaneous Q12H  . pantoprazole (PROTONIX) IV  40 mg Intravenous QHS  . protein supplement shake  2 oz Oral Q2H   Continuous Infusions: . sodium chloride 1,000 mL (07/21/17 0618)   PRN Meds:.oxyCODONE **AND** acetaminophen, acetaminophen, hydrALAZINE, morphine injection, ondansetron (ZOFRAN) IV, simethicone, traMADol  Assessment/Plan: Patient Active  Problem List   Diagnosis Date Noted  . Acute diverticulitis 10/08/2015  . Abdominal pain, left lower quadrant 10/08/2015  . Morbid obesity (HCC) 10/08/2015  . Vitamin D deficiency 01/17/2015   s/p Procedure(s): LAPAROSCOPIC GASTRIC SLEEVE RESECTION, UPPER ENDO 07/20/2017 -advance to shakes  Disposition:  LOS: 1 day  The patient will be in the hospital for normal postop protocol  Rodman Pickle, MD [phone] [facility]

## 2017-07-21 NOTE — Progress Notes (Signed)
Patient alert and oriented, Post op day 1.  Provided support and encouragement.  Encouraged pulmonary toilet, ambulation and small sips of liquids.  Completed 12 ounces of clear fluids, started protein at 8 am.  All questions answered.  Will continue to monitor.

## 2017-07-21 NOTE — Progress Notes (Signed)
Patient alert and oriented, pain is controlled. Patient is tolerating fluids, advanced to protein shake today, patient is tolerating well.  Reviewed Gastric sleeve discharge instructions with patient and patient is able to articulate understanding.  Provided information on BELT program, Support Group and [facility] outpatient pharmacy. All questions answered, will continue to monitor.  

## 2017-07-21 NOTE — Progress Notes (Signed)
Discharge instructions discussed with patient and family, verbalized agreement and understanding 

## 2017-07-23 ENCOUNTER — Encounter: Payer: Self-pay | Admitting: *Deleted

## 2017-07-23 ENCOUNTER — Other Ambulatory Visit: Payer: Self-pay | Admitting: *Deleted

## 2017-07-23 NOTE — Patient Outreach (Signed)
[facility]) Care Management  07/23/2017  Krista Fowler 05-Dec-1970 [id number]  Subjective: Telephone call to patient's home / mobile number, spoke with patient, and HIPAA verified.  Discussed [name] Care Management UMR Transition of care follow up, patient voiced understanding, and is in agreement to follow up.   Patient states she is feeling ok, tolerating the liquid diet, gets full easy, and has a follow up appointment with surgeon on 08/05/17.  Patient voices understanding of medical diagnosis, surgery, and treatment plan.  Patient had questions regarding family medical leave act (FMLA) process, advised patient to follow up with her assigned claim examiner regarding questions, and to obtain a copy of all FMLA paper work for her records.  Patient voices understanding and states she will follow up. States she call [employer] earlier today and initiated the FMLA process.  Patient states she is accessing the following [facility] benefits: outpatient pharmacy, hospital indemnity (did not choose this benefit), short term disability (verbally given contact number for [facility] [phone], she will follow up and initiate the claims process), and has started the  family medical leave act (FMLA) process. Patient states she does not have any education material, transition of care, care coordination, disease management, disease monitoring, transportation, community resource, or pharmacy needs at this time. States she is very appreciative of the follow up and is in agreement to receive [facility] Care Management information.   Objective: Per chart review, patient hospitalized 07/20/17 -07/21/17 at [facility].   Status post LAPAROSCOPIC GASTRIC SLEEVE RESECTION on 07/20/17.   Had ED visit on 01/12/17 for back pain.   Patient has a history of sciatica and diverticulitis.   Assessment: Received UMR Preoperative Follow up call referral on 07/10/17.Preoperative call not completed due to unable to reach  patient,  prior to surgery. Transition of care follow up completed, no care management needs, and will proceed with case closure.   Plan: RNCM will send patient successful outreach letter, [facility] pamphlet, and magnet. RNCM will send case closure due to follow up completed / no care management needs request to Krista Fowler at [facility] Care Management.     Krista Fowler H. Gardiner Barefoot, BSN, CCM [facility] Care Management [facility] Telephonic CM Phone: [phone] Fax: [phone]

## 2017-07-30 ENCOUNTER — Telehealth (HOSPITAL_COMMUNITY): Payer: Self-pay

## 2017-07-30 NOTE — Telephone Encounter (Signed)
Made discharge phone call to patient.  Asking the following questions.    1. Do you have someone to care for you now that you are home?  independent 2. Are you having pain now that is not relieved by your pain medication? Yes adding tylenol to help 3. Are you able to drink the recommended daily amount of fluids (48 ounces minimum/day) and protein (60-80 grams/day) as prescribed by the dietitian or nutritional counselor?  60 grams of protein, 48 ounces of fluids 4. Are you taking the vitamins and minerals as prescribed?  yes 5. Do you have the "on call" number to contact your surgeon if you have a problem or question?  yes 6. Are your incisions free of redness, swelling or drainage? (If steri strips, address that these can fall off, shower as tolerated) look good  7. Have your bowels moved since your surgery?  If not, are you passing gas?  Yes, after miralax 8. Are you up and walking 3-4 times per day?  yes 9. Were you provided your discharge medications before your surgery or before you were discharged from the hospital and are you taking them without problem?  yes

## 2017-08-04 ENCOUNTER — Encounter: Payer: 59 | Attending: General Surgery | Admitting: Registered"

## 2017-08-04 DIAGNOSIS — Z713 Dietary counseling and surveillance: Secondary | ICD-10-CM | POA: Diagnosis not present

## 2017-08-04 DIAGNOSIS — E669 Obesity, unspecified: Secondary | ICD-10-CM

## 2017-08-05 NOTE — Progress Notes (Signed)
Bariatric Class:  Appt start time: 1530 end time:  1630.  2 Week Post-Operative Nutrition Class  Patient was seen on 08/04/2017 for Post-Operative [facility]   Surgery date: 07/20/2017 Surgery type: Sleeve gastrectomy Start weight at [facility]: 297.9 Weight today: 283.6 Weight change: 14.3 lbs loss   Pt states she has some headaches, more so in the mornings. Pt states she has some pain from surgery site.    TANITA  BODY COMP RESULTS  08/04/2017   BMI (kg/m^2) 47.2   Fat Mass (lbs) 162.6   Fat Free Mass (lbs) 121.0   Total Body Water (lbs) 90.4   The following the learning objectives were met by the patient during this course:  Identifies Phase 3A (Soft, High Proteins) Dietary Goals and will begin from 2 weeks post-operatively to 2 months post-operatively  Identifies appropriate sources of fluids and proteins   States protein recommendations and appropriate sources post-operatively  Identifies the need for appropriate texture modifications, mastication, and bite sizes when consuming solids  Identifies appropriate multivitamin and calcium sources post-operatively  Describes the need for physical activity post-operatively and will follow MD recommendations  States when to call healthcare provider regarding medication questions or post-operative complications  Handouts given during class include:  Phase 3A: Soft, High Protein Diet Handout  Follow-Up Plan: Patient will follow-up at [facility] in 6 weeks for 2 month post-op nutrition visit for diet advancement per MD.

## 2017-09-15 ENCOUNTER — Ambulatory Visit: Payer: Self-pay | Admitting: Registered"

## 2017-09-19 DIAGNOSIS — T7840XA Allergy, unspecified, initial encounter: Secondary | ICD-10-CM | POA: Diagnosis not present

## 2017-09-19 DIAGNOSIS — T7849XA Other allergy, initial encounter: Secondary | ICD-10-CM | POA: Diagnosis not present

## 2017-09-19 DIAGNOSIS — R220 Localized swelling, mass and lump, head: Secondary | ICD-10-CM | POA: Diagnosis not present

## 2017-10-28 ENCOUNTER — Encounter: Payer: Self-pay | Admitting: Registered"

## 2017-10-28 ENCOUNTER — Encounter: Payer: 59 | Attending: General Surgery | Admitting: Registered"

## 2017-10-28 DIAGNOSIS — E669 Obesity, unspecified: Secondary | ICD-10-CM

## 2017-10-28 DIAGNOSIS — Z713 Dietary counseling and surveillance: Secondary | ICD-10-CM | POA: Insufficient documentation

## 2017-10-28 NOTE — Patient Instructions (Addendum)
-   Aim to eat every 3-5 hours. Set timer/reminders on [employer] App.   - Increase protein intake to at least 60 grams a day.   - Increase fluid intake to at least 64 ounces a day. Sip throughout your day.   - 9:30am - breakfast (2 eggs, etc)    1pm - lunch (2 oz grilled chicken)    5-5:30 - snack (PB3)    9pm - dinner (leftovers-grilled chicken)    11pm - snack (cheese stick)  - Aim to not drink 15 minutes before eating.

## 2017-10-28 NOTE — Progress Notes (Signed)
Follow-up visit:  12 Weeks Post-Operative Sleeve Gastrectomy Surgery  Medical Nutrition Therapy:  Appt start time: 9:25 end time: 10:18.  Primary concerns today: Post-operative Bariatric Surgery Nutrition Management.  Non scale victories: more energy, less pain   Surgery date: 07/20/2017 Surgery type: Sleeve gastrectomy [facility] Weight today: 256.0 lbs Weight change: 27.6 lbs loss from 283.6 lbs 08/04/2017 Total weight lost: 41.9 lbs Weight loss goal: prevent hypertension, diabetes, improve back and leg pain    TANITA  BODY COMP RESULTS  08/04/2017 10/28/2017   BMI (kg/m^2) 47.2 42.6   Fat Mass (lbs) 162.6 136.4   Fat Free Mass (lbs) 121.0 119.6   Total Body Water (lbs) 90.4 88.0    Pt states she is barely eating. Pt states she needs to make time but she doesn't. 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 Pt states she:  9:30am wakes up  1:30-2pm leaves for work 2:45 arrives at work 5pm transitions at work 9pm dinner 11pm leaves work 11:30-12am maybe a snack  Pt states she does cook because she eats so little, would rather pick up food like a granola bar.Pt states she eats 2 meals/day mostly. Pt states she eats a lot of chicken. Pt states she knows what she needs to do and when she returns things will be better. Pt is not meeting protein or fluid goals.   Preferred Learning Style:   No preference indicated   Learning Readiness:   Contemplating  Ready  Change in progress  24-hr recall: B (AM): [employer]-egg Mcmuffin (a few bites) (9g) or fried sausage with bread Snk (AM): granola bar  L (PM): skips Snk (PM): sometimes peanut butter with crackers (4g) D (PM): salad (spinach, ham bits, cheese, dill pickles, lite italian dressing) (14g) Snk (PM): citrus salad   Fluid intake: crystal light (20 oz), cappuccino with creamer (8 oz), green tea sometimes; ~28  ounces Estimated total protein intake: ~27 grams   Medications: See list Supplementation: 2 Celebrate + 3 Ca  Using straws: no Drinking while eating: sometimes Having you been chewing well: yes Chewing/swallowing difficulties: no Changes in vision: no Changes to mood/headaches: no Hair loss/Changes to skin/Changes to nails: no, no Any difficulty focusing or concentrating: no Sweating: no Dizziness/Lightheaded: no, no Palpitations: no  Carbonated beverages: no N/V/D/C/GAS: no, no, no, no, no Abdominal Pain: no Dumping syndrome: no Last Lap-Band fill: N/A  Recent physical activity:  Walking at work  Progress Towards Goal(s):  In progress.  Handouts given during visit include:  Baritastic App   Nutritional Diagnosis:  Inadequate fluid intake As related to bariatric surgery post-op recommendations.  As evidenced by pt report of less than 64 ounces of fluid daily. [id number] Inadequate protein intake As related to bariatric surgery post-op recommendations.  As evidenced by pt report of less than 60 grams of protein daily.    Intervention:  Nutrition education and counseling. Pt was educated and counseled on importance of meeting protein and fluid needs daily.  Goals: - Aim to eat every 3-5 hours. Set timer/reminders on Baritastic App.  - Increase protein intake to at least 60 grams a day.  - Increase fluid intake to at least 64 ounces a day. Sip throughout your day.  - 9:30am - breakfast (2 eggs, etc)    1pm - lunch (2 oz grilled chicken)    5-5:30 - snack (PB3)    9pm - dinner (  leftovers-grilled chicken)    11pm - snack (cheese stick) - Aim to not drink 15 minutes before eating.   Teaching Method Utilized:  Visual Auditory Hands on  Barriers to learning/adherence to lifestyle change: work-life balance  Demonstrated degree of understanding via:  Teach Back   Monitoring/Evaluation:  Dietary intake, exercise, and body weight. Follow up in 2 weeks for 3.5 month post-op  visit.

## 2017-11-11 ENCOUNTER — Ambulatory Visit: Payer: Self-pay | Admitting: Registered"

## 2017-11-17 ENCOUNTER — Ambulatory Visit: Payer: Self-pay | Admitting: Registered"

## 2017-12-04 DIAGNOSIS — Z9884 Bariatric surgery status: Secondary | ICD-10-CM | POA: Diagnosis not present

## 2017-12-04 DIAGNOSIS — R69 Illness, unspecified: Secondary | ICD-10-CM | POA: Diagnosis not present

## 2017-12-04 DIAGNOSIS — E669 Obesity, unspecified: Secondary | ICD-10-CM | POA: Diagnosis not present

## 2017-12-04 DIAGNOSIS — K912 Postsurgical malabsorption, not elsewhere classified: Secondary | ICD-10-CM | POA: Diagnosis not present

## 2018-05-18 ENCOUNTER — Ambulatory Visit: Payer: Self-pay | Admitting: Registered"

## 2019-12-19 ENCOUNTER — Other Ambulatory Visit: Payer: Self-pay | Admitting: *Deleted

## 2019-12-19 DIAGNOSIS — Z20822 Contact with and (suspected) exposure to covid-19: Secondary | ICD-10-CM

## 2019-12-20 LAB — NOVEL CORONAVIRUS, NAA: SARS-CoV-2, NAA: NOT DETECTED

## 2020-01-20 ENCOUNTER — Ambulatory Visit: Payer: 59 | Admitting: Allergy

## 2020-01-31 ENCOUNTER — Encounter (HOSPITAL_COMMUNITY): Payer: Self-pay

## 2020-02-10 ENCOUNTER — Other Ambulatory Visit: Payer: Self-pay

## 2020-02-10 ENCOUNTER — Ambulatory Visit (INDEPENDENT_AMBULATORY_CARE_PROVIDER_SITE_OTHER): Payer: 59 | Admitting: Allergy

## 2020-02-10 ENCOUNTER — Encounter: Payer: Self-pay | Admitting: Allergy

## 2020-02-10 VITALS — BP 128/72 | HR 85 | Temp 97.2°F | Resp 16 | Ht 65.0 in | Wt 256.0 lb

## 2020-02-10 DIAGNOSIS — H1013 Acute atopic conjunctivitis, bilateral: Secondary | ICD-10-CM | POA: Diagnosis not present

## 2020-02-10 DIAGNOSIS — L508 Other urticaria: Secondary | ICD-10-CM | POA: Diagnosis not present

## 2020-02-10 DIAGNOSIS — L234 Allergic contact dermatitis due to dyes: Secondary | ICD-10-CM

## 2020-02-10 DIAGNOSIS — T781XXD Other adverse food reactions, not elsewhere classified, subsequent encounter: Secondary | ICD-10-CM | POA: Diagnosis not present

## 2020-02-10 DIAGNOSIS — J3089 Other allergic rhinitis: Secondary | ICD-10-CM | POA: Diagnosis not present

## 2020-02-10 DIAGNOSIS — T7819XD Other adverse food reactions, not elsewhere classified, subsequent encounter: Secondary | ICD-10-CM

## 2020-02-10 MED ORDER — EPINEPHRINE 0.3 MG/0.3ML IJ SOAJ: 0.3000 mg | INTRAMUSCULAR | 1 refills | Status: DC | PRN

## 2020-02-10 MED ORDER — PANTOPRAZOLE SODIUM 40 MG PO TBEC: 40.0000 mg | DELAYED_RELEASE_TABLET | Freq: Every day | ORAL | 5 refills | Status: DC

## 2020-02-10 NOTE — Patient Instructions (Addendum)
-  Environmental allergy skin testing today is positive to tree pollen, grass pollen and dust mites. -Allergen avoidance measures discussed/handouts provided -Recommend trial of either Allegra 180 mg or Xyzal 5 mg daily as needed for control of allergy symptoms. -For itchy watery eyes can use over-the-counter Pataday 1 drop each eye daily as needed -For hive control recommend starting either any histamine above at the first sign of itching or hives.  Can take antihistamines twice a day if needed.  If hives continue to occur despite antihistamines twice a day then would recommend adding in Pepcid.  If hives continue to persist let us know. -Common food allergy testing is negative.  Shellfish skin testing is positive to lobster.  Would recommend avoidance of shellfish in the diet to prevent potential reactions.  Recommend you have access to an epinephrine device in case of a reaction.  Follow emergency action plan in case of a reaction. -Recommend performing patch testing to the true test panel to confirm allergy to PPD (common allergen and hair dye products) as well to determine if you have any other contact allergens. -Recommend you can try natural day products like natural Henna to color your hair if needed.  Would perform a patch placement first before full application.  You can search online for natural products (see below for option that I personally use).     Follow-up 6 months or sooner if needed   True Test looks for the following sensitivities:

## 2020-02-10 NOTE — Progress Notes (Signed)
New Patient Note  RE: Krista Fowler MRN: [id number] DOB: 10/24/1971 Date of Office Visit: 02/10/2020  Referring provider: No ref. provider found Primary care provider: Janora Norlander, DO  Chief Complaint: allergy testing, hives  History of present illness: Krista Fowler is a 49 y.o. female presenting today for evaluation of allergies, hives and possible food allergy.  She states year-round allergy symptoms with runny nose, itchy skin, watery/itchy eyes.  She has used OTC visine eyedrop on occasion.  She states she tried zyrtec years ago and it made her extremely drowsy.   She states when she eats shrimp sometimes she may get itchy or red blotchy areas/hives.  She states this does not happen with shrimp every time shes it.  She states she has not had hives or itching if she has prepared herself but typically will occur with shrimp prepared at a restaurant. She eats fish without issue.  She also reports having random hives as well outside of food ingestion.  She states she has had hives when she gets cold.  She reports pressure induced hives like around her sock line.  She feels like she has had hives over the last 5 years.  She lets the hives resolve on their own.  Hives do not leave any marks or bruising behind.  No swelling.  She can have several episodes a year.  She does eat red meats in her diet on occasion.   No history asthma, eczema.   She has had reaction with hair dye for first time around 2012-2014.   She states she used to dye her own hair frequently but she had an reaction where she had facial swelling, "bumps in the scalp" and required emergency department care for management.  She was treated with prednisone and antibiotics.  She states she tried to dye her hair again with a different brand and she had a similar reaction.  In 2018 she had her eye brows tinted with a dark brown color and she had swelling and dermatitis of the eyebrows similar to the hair dye that  required prednisone and antibiotics to treat.  Review of systems: Review of Systems  Constitutional: Negative.   HENT: Negative.   Eyes: Negative.   Respiratory: Negative.   Cardiovascular: Negative.   Gastrointestinal: Negative.   Musculoskeletal: Negative.   Skin: Negative.   Neurological: Negative.     All other systems negative unless noted above in HPI  Past medical history: Past Medical History:  Diagnosis Date  . GERD (gastroesophageal reflux disease)   . History of hiatal hernia   . Urticaria     Past surgical history: Past Surgical History:  Procedure Laterality Date  . ABDOMINAL HYSTERECTOMY     partial  . LAPAROSCOPIC GASTRIC SLEEVE RESECTION N/A 07/20/2017   Procedure: LAPAROSCOPIC GASTRIC SLEEVE RESECTION, UPPER ENDO;  Surgeon: Kinsinger, Arta Bruce, MD;  Location: [facility];  Service: General;  Laterality: N/A;  . TUBAL LIGATION      Family history:  Family History  Problem Relation Age of Onset  . Hypertension Mother   . Arthritis Mother   . Diabetes Father   . Hypertension Father   . Hypertension Brother   . Leukemia Maternal Grandmother   . Hypertension Maternal Grandmother   . Diabetes Paternal Grandmother   . Cancer Other   . Stroke Other   . Heart disease Other     Social history: She lives in a home with good carpeting covering with gas heating and  window cooling.  No pets in the home.  No concern for water damage, mildew or roaches in the home.  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  Denies a smoking history.  Medication List: Current Outpatient Medications  Medication Sig Dispense Refill  . calcium carbonate (TUMS - DOSED IN MG ELEMENTAL CALCIUM) 500 MG chewable tablet Chew 1 tablet by mouth 3 (three) times daily.    . Multiple Vitamin (MULTI-VITAMIN DAILY PO) Take 1 tablet by mouth. Takes daily, soft chew tablet.    . pantoprazole (PROTONIX) 40 MG tablet Take 1 tablet (40 mg total) by mouth daily. 30 tablet 5  . EPINEPHrine 0.3 mg/0.3 mL  IJ SOAJ injection Inject 0.3 mLs (0.3 mg total) into the muscle as needed for anaphylaxis. 2 each 1   No current facility-administered medications for this visit.    Known medication allergies: Allergies  Allergen Reactions  . Chlorhexidine Itching     Physical examination: Blood pressure 128/72, pulse 85, temperature (!) 97.2 F (36.2 C), temperature source Temporal, resp. rate 16, height 5\' 5"  (1.651 m), weight 256 lb (116.1 kg), SpO2 99 %.  General: Alert, interactive, in no acute distress. HEENT: PERRLA, TMs pearly gray, turbinates non-edematous without discharge, post-pharynx non erythematous. Neck: Supple without lymphadenopathy. Lungs: Clear to auscultation without wheezing, rhonchi or rales. {no increased work of breathing. CV: Normal S1, S2 without murmurs. Abdomen: Nondistended, nontender. Skin: Warm and dry, without lesions or rashes. Extremities:  No clubbing, cyanosis or edema. Neuro:   Grossly intact.  Diagnositics/Labs:  Allergy testing: Environmental allergy skin prick testing is positive to , [place], both dust mites.  Common food allergen skin prick testing is negative.  Shellfish panel skin prick testing is positive to lobster.  Intradermal environmental skin testing is negative. Allergy testing results were read and interpreted by provider, documented by clinical staff.   Assessment and plan: Allergic rhinitis with conjunctivitis Chronic urticaria Adverse food reaction Contact dermatitis due to dye   -Environmental allergy skin testing today is positive to tree pollen, grass pollen and dust mites. -Allergen avoidance measures discussed/handouts provided -Recommend trial of either Allegra 180 mg or Xyzal 5 mg daily as needed for control of allergy symptoms. -For itchy watery eyes can use over-the-counter Pataday 1 drop each eye daily as needed -For hive control recommend starting either any histamine above at the first sign of itching or  hives.  Can take antihistamines twice a day if needed.  If hives continue to occur despite antihistamines twice a day then would recommend adding in Pepcid.  If hives continue to persist let [employer] know. -Common food allergy testing is negative.  Shellfish skin testing is positive to lobster.  Would recommend avoidance of shellfish in the diet to prevent potential reactions.  Recommend you have access to an epinephrine device in case of a reaction.  Follow emergency action plan in case of a reaction. -Recommend performing patch testing to the true test panel to confirm allergy to PPD (common allergen and hair dye products) as well to determine if you have any other contact allergens. -Recommend you can try natural day products like natural Henna to color your hair if needed.  Would perform a patch placement first before full application.  You can search online for natural products (see below for option that I personally use).     Follow-up 6 months or sooner if needed  I appreciate the opportunity to take part in Mindee's care. Please do not hesitate to contact me  with questions.  Sincerely,   Margo Aye, MD Allergy/Immunology [facility]

## 2020-02-17 LAB — COMPREHENSIVE METABOLIC PANEL
ALT: 15 IU/L (ref 0–32)
AST: 20 IU/L (ref 0–40)
Albumin/Globulin Ratio: 1.3 (ref 1.2–2.2)
Albumin: 4.1 g/dL (ref 3.8–4.8)
Alkaline Phosphatase: 100 IU/L (ref 39–117)
BUN/Creatinine Ratio: 15 (ref 9–23)
BUN: 13 mg/dL (ref 6–24)
Bilirubin Total: 0.2 mg/dL (ref 0.0–1.2)
CO2: 25 mmol/L (ref 20–29)
Calcium: 9.3 mg/dL (ref 8.7–10.2)
Chloride: 104 mmol/L (ref 96–106)
Creatinine, Ser: 0.85 mg/dL (ref 0.57–1.00)
GFR calc Af Amer: 92 mL/min/{1.73_m2} (ref >59)
GFR calc non Af Amer: 80 mL/min/{1.73_m2} (ref >59)
Globulin, Total: 3.1 g/dL (ref 1.5–4.5)
Glucose: 155 mg/dL — ABNORMAL HIGH (ref 65–99)
Potassium: 4.1 mmol/L (ref 3.5–5.2)
Sodium: 143 mmol/L (ref 134–144)
Total Protein: 7.2 g/dL (ref 6.0–8.5)

## 2020-02-17 LAB — CBC WITH DIFFERENTIAL/PLATELET
Basophils Absolute: 0 10*3/uL (ref 0.0–0.2)
Basos: 0 %
EOS (ABSOLUTE): 0.1 10*3/uL (ref 0.0–0.4)
Eos: 1 %
Hematocrit: 39.2 % (ref 34.0–46.6)
Hemoglobin: 12.9 g/dL (ref 11.1–15.9)
Immature Grans (Abs): 0 10*3/uL (ref 0.0–0.1)
Immature Granulocytes: 0 %
Lymphocytes Absolute: 2.7 10*3/uL (ref 0.7–3.1)
Lymphs: 56 %
MCH: 28.8 pg (ref 26.6–33.0)
MCHC: 32.9 g/dL (ref 31.5–35.7)
MCV: 88 fL (ref 79–97)
Monocytes Absolute: 0.3 10*3/uL (ref 0.1–0.9)
Monocytes: 7 %
Neutrophils Absolute: 1.7 10*3/uL (ref 1.4–7.0)
Neutrophils: 36 %
Platelets: 286 10*3/uL (ref 150–450)
RBC: 4.48 x10E6/uL (ref 3.77–5.28)
RDW: 13.2 % (ref 11.7–15.4)
WBC: 4.8 10*3/uL (ref 3.4–10.8)

## 2020-02-17 LAB — ALLERGEN PROFILE, SHELLFISH
Clam IgE: <0.1 kU/L
F023-IgE Crab: <0.1 kU/L
F080-IgE Lobster: <0.1 kU/L
F290-IgE Oyster: <0.1 kU/L
Scallop IgE: <0.1 kU/L
Shrimp IgE: <0.1 kU/L

## 2020-02-17 LAB — THYROID ANTIBODIES
Thyroglobulin Antibody: 8.5 IU/mL — ABNORMAL HIGH (ref 0.0–0.9)
Thyroperoxidase Ab SerPl-aCnc: 34 IU/mL (ref 0–34)

## 2020-02-17 LAB — TRYPTASE: Tryptase: 9 ug/L (ref 2.2–13.2)

## 2020-02-17 LAB — ALPHA-GAL PANEL
Alpha Gal IgE*: <0.1 kU/L (ref <0.10)
Beef (Bos spp) IgE: <0.1 kU/L (ref <0.35)
Class Interpretation: 0
Class Interpretation: 0
Class Interpretation: 0
Lamb/Mutton (Ovis spp) IgE: <0.1 kU/L (ref <0.35)
Pork (Sus spp) IgE: <0.1 kU/L (ref <0.35)

## 2020-02-17 LAB — CHRONIC URTICARIA: cu index: 1.1 (ref <10)

## 2020-03-01 ENCOUNTER — Ambulatory Visit (INDEPENDENT_AMBULATORY_CARE_PROVIDER_SITE_OTHER): Payer: 59 | Admitting: Family Medicine

## 2020-03-01 ENCOUNTER — Encounter: Payer: Self-pay | Admitting: Family Medicine

## 2020-03-01 ENCOUNTER — Other Ambulatory Visit: Payer: Self-pay

## 2020-03-01 VITALS — BP 138/79 | HR 52 | Temp 98.7°F | Ht 65.0 in | Wt 263.0 lb

## 2020-03-01 DIAGNOSIS — Z01411 Encounter for gynecological examination (general) (routine) with abnormal findings: Secondary | ICD-10-CM

## 2020-03-01 DIAGNOSIS — Z01419 Encounter for gynecological examination (general) (routine) without abnormal findings: Secondary | ICD-10-CM

## 2020-03-01 DIAGNOSIS — L659 Nonscarring hair loss, unspecified: Secondary | ICD-10-CM | POA: Diagnosis not present

## 2020-03-01 DIAGNOSIS — R635 Abnormal weight gain: Secondary | ICD-10-CM

## 2020-03-01 DIAGNOSIS — Z903 Acquired absence of stomach [part of]: Secondary | ICD-10-CM

## 2020-03-01 DIAGNOSIS — R768 Other specified abnormal immunological findings in serum: Secondary | ICD-10-CM

## 2020-03-01 DIAGNOSIS — Z1211 Encounter for screening for malignant neoplasm of colon: Secondary | ICD-10-CM | POA: Diagnosis not present

## 2020-03-01 DIAGNOSIS — Z1231 Encounter for screening mammogram for malignant neoplasm of breast: Secondary | ICD-10-CM | POA: Diagnosis not present

## 2020-03-01 DIAGNOSIS — Z124 Encounter for screening for malignant neoplasm of cervix: Secondary | ICD-10-CM

## 2020-03-01 LAB — BAYER DCA HB A1C WAIVED: HB A1C (BAYER DCA - WAIVED): 5.7 % (ref <7.0)

## 2020-03-01 NOTE — Patient Instructions (Signed)
You had labs performed today.  You will be contacted with the results of the labs once they are available, usually in the next 3 business days for routine lab work.  If you have an active my chart account, they will be released to your MyChart.  If you prefer to have these labs released to you via telephone, please let us know.  If you had a pap smear or biopsy performed, expect to be contacted in about 7-10 days.   Preventive Care 27-50 Years Old, Female Preventive care refers to visits with your health care provider and lifestyle choices that can promote health and wellness. This includes:  A yearly physical exam. This may also be called an annual well check.  Regular dental visits and eye exams.  Immunizations.  Screening for certain conditions.  Healthy lifestyle choices, such as eating a healthy diet, getting regular exercise, not using drugs or products that contain nicotine and tobacco, and limiting alcohol use. What can I expect for my preventive care visit? Physical exam Your health care provider will check your:  Height and weight. This may be used to calculate body mass index (BMI), which tells if you are at a healthy weight.  Heart rate and blood pressure.  Skin for abnormal spots. Counseling Your health care provider may ask you questions about your:  Alcohol, tobacco, and drug use.  Emotional well-being.  Home and relationship well-being.  Sexual activity.  Eating habits.  Work and work Statistician.  Method of birth control.  Menstrual cycle.  Pregnancy history. What immunizations do I need?  Influenza (flu) vaccine  This is recommended every year. Tetanus, diphtheria, and pertussis (Tdap) vaccine  You may need a Td booster every 10 years. Varicella (chickenpox) vaccine  You may need this if you have not been vaccinated. Zoster (shingles) vaccine  You may need this after age 67. Measles, mumps, and rubella (MMR) vaccine  You may need at  least one dose of MMR if you were born in 1957 or later. You may also need a second dose. Pneumococcal conjugate (PCV13) vaccine  You may need this if you have certain conditions and were not previously vaccinated. Pneumococcal polysaccharide (PPSV23) vaccine  You may need one or two doses if you smoke cigarettes or if you have certain conditions. Meningococcal conjugate (MenACWY) vaccine  You may need this if you have certain conditions. Hepatitis A vaccine  You may need this if you have certain conditions or if you travel or work in places where you may be exposed to hepatitis A. Hepatitis B vaccine  You may need this if you have certain conditions or if you travel or work in places where you may be exposed to hepatitis B. Haemophilus influenzae type b (Hib) vaccine  You may need this if you have certain conditions. Human papillomavirus (HPV) vaccine  If recommended by your health care provider, you may need three doses over 6 months. You may receive vaccines as individual doses or as more than one vaccine together in one shot (combination vaccines). Talk with your health care provider about the risks and benefits of combination vaccines. What tests do I need? Blood tests  Lipid and cholesterol levels. These may be checked every 5 years, or more frequently if you are over 27 years old.  Hepatitis C test.  Hepatitis B test. Screening  Lung cancer screening. You may have this screening every year starting at age 40 if you have a 30-pack-year history of smoking and currently smoke or  have quit within the past 15 years.  Colorectal cancer screening. All adults should have this screening starting at age 81 and continuing until age 15. Your health care provider may recommend screening at age 25 if you are at increased risk. You will have tests every 1-10 years, depending on your results and the type of screening test.  Diabetes screening. This is done by checking your blood sugar  (glucose) after you have not eaten for a while (fasting). You may have this done every 1-3 years.  Mammogram. This may be done every 1-2 years. Talk with your health care provider about when you should start having regular mammograms. This may depend on whether you have a family history of breast cancer.  BRCA-related cancer screening. This may be done if you have a family history of breast, ovarian, tubal, or peritoneal cancers.  Pelvic exam and Pap test. This may be done every 3 years starting at age 58. Starting at age 15, this may be done every 5 years if you have a Pap test in combination with an HPV test. Other tests  Sexually transmitted disease (STD) testing.  Bone density scan. This is done to screen for osteoporosis. You may have this scan if you are at high risk for osteoporosis. Follow these instructions at home: Eating and drinking  Eat a diet that includes fresh fruits and vegetables, whole grains, lean protein, and low-fat dairy.  Take vitamin and mineral supplements as recommended by your health care provider.  Do not drink alcohol if: ? Your health care provider tells you not to drink. ? You are pregnant, may be pregnant, or are planning to become pregnant.  If you drink alcohol: ? Limit how much you have to 0-1 drink a day. ? Be aware of how much alcohol is in your drink. In the [place] one drink equals one 12 oz bottle of beer (355 mL), one 5 oz glass of wine (148 mL), or one 1 oz glass of hard liquor (44 mL). Lifestyle  Take daily care of your teeth and gums.  Stay active. Exercise for at least 30 minutes on 5 or more days each week.  Do not use any products that contain nicotine or tobacco, such as cigarettes, e-cigarettes, and chewing tobacco. If you need help quitting, ask your health care provider.  If you are sexually active, practice safe sex. Use a condom or other form of birth control (contraception) in order to prevent pregnancy and STIs (sexually  transmitted infections).  If told by your health care provider, take low-dose aspirin daily starting at age 27. What's next?  Visit your health care provider once a year for a well check visit.  Ask your health care provider how often you should have your eyes and teeth checked.  Stay up to date on all vaccines. This information is not intended to replace advice given to you by your health care provider. Make sure you discuss any questions you have with your health care provider. Document Revised: 07/29/2018 Document Reviewed: 07/29/2018 Elsevier Patient Education  2020 [employer].

## 2020-03-01 NOTE — Progress Notes (Signed)
Krista Fowler is a 49 y.o. female presents to office today for annual physical exam examination.    Concerns today include: 1.  Hair loss/obesity Patient reports that she has had quite a bit of hair thinning and difficulty with weight gain in the last year.  She admits that she is not eating as well as she should be nor she exercising.  She is wanting to know if she should go on weight loss medicines.  She does have a history of gastric sleeve several years ago.  She had lost about 70 pounds after the gastric sleeve but has gained about 30 of that back over the last year.  Does not report any change in bowel habits, change in vision, difficulty swallowing or change in voice.  No tremor or heart palpitations.  She had recent labs done which showed positive thyroglobulin.  She wonders if she has a thyroid disorder.  No history of radiation to the head or neck.  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 GERD (gastroesophageal reflux disease)   . History of hiatal hernia   . Urticaria    Social History   Socioeconomic History  . Marital status: Single    Spouse name: Not on file  . Number of children: 2  . Years of education: Not on file  . Highest education level: Not on file  Occupational History  . Not on file  Tobacco Use  . Smoking status: Never Smoker  . Smokeless tobacco: Never Used  Substance and Sexual Activity  . Alcohol use: No  . Drug use: No  . Sexual activity: Yes    Birth control/protection: Surgical  Other Topics Concern  . 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   Social Determinants of Health   Financial Resource Strain:   . Difficulty of Paying Living Expenses:   Food Insecurity:   . Worried About Charity fundraiser in the Last Year:   . Arboriculturist in the Last Year:   Transportation Needs:   . Film/video editor (Medical):   Marland Kitchen Lack of Transportation (Non-Medical):   Physical Activity:   . Days of Exercise per Week:   . Minutes of Exercise per Session:   Stress:   . Feeling of Stress :   Social Connections:   . Frequency of Communication with Friends and Family:   . Frequency of Social Gatherings with Friends and Family:   . Attends Religious Services:   . Active Member of Clubs or Organizations:   . Attends Archivist Meetings:   Marland Kitchen Marital Status:   Intimate Partner Violence:   . Fear of Current or Ex-Partner:   . Emotionally Abused:   Marland Kitchen Physically Abused:   . Sexually Abused:    Past Surgical History:  Procedure Laterality Date  . ABDOMINAL HYSTERECTOMY     partial  . LAPAROSCOPIC GASTRIC SLEEVE RESECTION N/A 07/20/2017   Procedure: LAPAROSCOPIC GASTRIC SLEEVE RESECTION, UPPER ENDO;  Surgeon: Kieth Brightly Arta Bruce, MD;  Location: [facility];  Service: General;  Laterality: N/A;  .  TUBAL LIGATION     Family History  Problem Relation Age of Onset  . Hypertension Mother   . Arthritis Mother   . Diabetes Father   . Hypertension Father   . Hypertension Brother   . Leukemia Maternal Grandmother   . Hypertension Maternal Grandmother   . Diabetes Paternal Grandmother   . Cancer Other   . Stroke Other   . Heart disease Other     Current Outpatient Medications:  .  calcium carbonate (TUMS - DOSED IN MG ELEMENTAL CALCIUM) 500 MG chewable tablet, Chew 1 tablet by mouth 3 (three) times daily., Disp: , Rfl:  .  EPINEPHrine 0.3 mg/0.3 mL IJ SOAJ injection, Inject 0.3 mLs (0.3 mg total) into the muscle as needed for anaphylaxis., Disp: 2 each, Rfl: 1 .  Multiple Vitamin (MULTI-VITAMIN  DAILY PO), Take 1 tablet by mouth. Takes daily, soft chew tablet., Disp: , Rfl:  .  pantoprazole (PROTONIX) 40 MG tablet, Take 1 tablet (40 mg total) by mouth daily., Disp: 30 tablet, Rfl: 5  Allergies  Allergen Reactions  . Chlorhexidine Itching     ROS: Review of Systems Pertinent items noted in HPI and remainder of comprehensive ROS otherwise negative.    Physical exam BP 138/79   Pulse (!) 52   Temp 98.7 F (37.1 C) (Temporal)   Ht '5\' 5"'$  (1.651 m)   Wt 263 lb (119.3 kg)   SpO2 100%   BMI 43.77 kg/m  General appearance: alert, cooperative, appears stated age, no distress and morbidly obese Head: Normocephalic, without obvious abnormality, atraumatic; thinning of hair noted.  Thinning of the hair at the edges of eyebrows also present Eyes: negative findings: lids and lashes normal, conjunctivae and sclerae normal, corneas clear and pupils equal, round, reactive to light and accomodation Ears: normal TM's and external ear canals both ears Nose: Nares normal. Septum midline. Mucosa normal. No drainage or sinus tenderness. Throat: lips, mucosa, and tongue normal; teeth and gums normal Neck: no adenopathy, no carotid bruit, supple, symmetrical, trachea midline and ?left upper lobe thyroid nodule Back: symmetric, no curvature. ROM normal. No CVA tenderness. Lungs: clear to auscultation bilaterally Breasts: normal appearance, no masses or tenderness, No nipple retraction or dimpling, No nipple discharge or bleeding, No axillary or supraclavicular adenopathy, Normal to palpation without dominant masses Heart: regular rate and rhythm, S1, S2 normal, no murmur, click, rub or gallop Abdomen: soft, non-tender; bowel sounds normal; no masses,  no organomegaly Pelvic: cervix normal in appearance, external genitalia normal, no adnexal masses or tenderness, no cervical motion tenderness, rectovaginal septum normal, uterus normal size, shape, and consistency and vagina normal without  discharge Extremities: extremities normal, atraumatic, no cyanosis or edema Pulses: 2+ and symmetric Skin: Skin color, texture, turgor normal. No rashes or lesions Lymph nodes: Cervical, supraclavicular, and axillary nodes normal. Neurologic: Alert and oriented X 3, normal strength and tone. Normal symmetric reflexes. Normal coordination and gait Psych: Mood stable, speech normal, affect appropriate, pleasant and interactive Depression screen [facility] 2/9 03/01/2020 10/28/2017 03/27/2017  Decreased Interest 0 0 0  Down, Depressed, Hopeless 0 0 0  PHQ - 2 Score 0 0 0  Altered sleeping 0 - -  Tired, decreased energy 0 - -  Change in appetite 0 - -  Feeling bad or failure about yourself  0 - -  Trouble concentrating 0 - -  Moving slowly or fidgety/restless 0 - -  Suicidal thoughts 0 - -  PHQ-9 Score 0 - -     Assessment/  Plan: [name] here for annual physical exam.   1. Well woman exam with routine gynecological exam  2. Screening for malignant neoplasm of cervix - IGP, Aptima HPV  3. Hair thinning Positive thyroglobulin noted after lab review in EMR.  Check thyroid panel - CMP14+EGFR - Vitamin B12 - Ferritin - Thyroid Panel With TSH  4. Weight gain Likely due to lifestyle but given positive thyroglobulin will evaluate for metabolic etiology. - Thyroid Panel With TSH  5. History of sleeve gastrectomy I reinforced need for daily vitamins given history of gastrectomy.  We will check for vitamin deficiency - CMP14+EGFR - CBC - Vitamin B12 - VITAMIN D 25 Hydroxy (Vit-D Deficiency, Fractures) - Ferritin  6. Thyroglobulin antibody positive - Thyroid Panel With TSH  7. Morbid obesity ([place]) - Lipid Panel - Bayer DCA Hb A1c Waived  8. Screening for malignant neoplasm of colon - Ambulatory referral to Gastroenterology  9. Encounter for screening mammogram for malignant neoplasm of breast - MM 3D SCREEN BREAST BILATERAL; Future  Counseled on healthy lifestyle choices,  including diet (rich in fruits, vegetables and lean meats and low in salt and simple carbohydrates) and exercise (at least 30 minutes of moderate physical activity daily).  Patient to follow up in 1 year for annual exam or sooner if needed.  Daxtin Leiker M. Lajuana Ripple, DO

## 2020-03-02 LAB — CMP14+EGFR
ALT: 36 IU/L — ABNORMAL HIGH (ref 0–32)
AST: 35 IU/L (ref 0–40)
Albumin/Globulin Ratio: 1.3 (ref 1.2–2.2)
Albumin: 4.2 g/dL (ref 3.8–4.8)
Alkaline Phosphatase: 97 IU/L (ref 39–117)
BUN/Creatinine Ratio: 11 (ref 9–23)
BUN: 8 mg/dL (ref 6–24)
Bilirubin Total: 0.3 mg/dL (ref 0.0–1.2)
CO2: 25 mmol/L (ref 20–29)
Calcium: 9.4 mg/dL (ref 8.7–10.2)
Chloride: 104 mmol/L (ref 96–106)
Creatinine, Ser: 0.76 mg/dL (ref 0.57–1.00)
GFR calc Af Amer: 106 mL/min/{1.73_m2} (ref >59)
GFR calc non Af Amer: 92 mL/min/{1.73_m2} (ref >59)
Globulin, Total: 3.2 g/dL (ref 1.5–4.5)
Glucose: 92 mg/dL (ref 65–99)
Potassium: 4.1 mmol/L (ref 3.5–5.2)
Sodium: 141 mmol/L (ref 134–144)
Total Protein: 7.4 g/dL (ref 6.0–8.5)

## 2020-03-02 LAB — FERRITIN: Ferritin: 43 ng/mL (ref 15–150)

## 2020-03-02 LAB — LIPID PANEL
Chol/HDL Ratio: 2.2 ratio (ref 0.0–4.4)
Cholesterol, Total: 210 mg/dL — ABNORMAL HIGH (ref 100–199)
HDL: 96 mg/dL (ref >39)
LDL Chol Calc (NIH): 103 mg/dL — ABNORMAL HIGH (ref 0–99)
Triglycerides: 62 mg/dL (ref 0–149)
VLDL Cholesterol Cal: 11 mg/dL (ref 5–40)

## 2020-03-02 LAB — THYROID PANEL WITH TSH
Free Thyroxine Index: 1.6 (ref 1.2–4.9)
T3 Uptake Ratio: 27 % (ref 24–39)
T4, Total: 6 ug/dL (ref 4.5–12.0)
TSH: 1.17 u[IU]/mL (ref 0.450–4.500)

## 2020-03-02 LAB — CBC
Hematocrit: 37.2 % (ref 34.0–46.6)
Hemoglobin: 12.6 g/dL (ref 11.1–15.9)
MCH: 29 pg (ref 26.6–33.0)
MCHC: 33.9 g/dL (ref 31.5–35.7)
MCV: 86 fL (ref 79–97)
Platelets: 253 10*3/uL (ref 150–450)
RBC: 4.35 x10E6/uL (ref 3.77–5.28)
RDW: 13.3 % (ref 11.7–15.4)
WBC: 5.6 10*3/uL (ref 3.4–10.8)

## 2020-03-02 LAB — VITAMIN D 25 HYDROXY (VIT D DEFICIENCY, FRACTURES): Vit D, 25-Hydroxy: 17.7 ng/mL — ABNORMAL LOW (ref 30.0–100.0)

## 2020-03-02 LAB — VITAMIN B12: Vitamin B-12: 259 pg/mL (ref 232–1245)

## 2020-03-03 ENCOUNTER — Encounter: Payer: Self-pay | Admitting: Family Medicine

## 2020-03-03 LAB — IGP, APTIMA HPV: HPV Aptima: NEGATIVE

## 2020-03-05 ENCOUNTER — Encounter: Payer: Self-pay | Admitting: Internal Medicine

## 2020-03-05 ENCOUNTER — Other Ambulatory Visit: Payer: Self-pay | Admitting: Family Medicine

## 2020-03-05 MED ORDER — CHOLECALCIFEROL 1.25 MG (50000 UT) PO CAPS: 50000.0000 [IU] | ORAL_CAPSULE | ORAL | 0 refills | Status: AC

## 2020-03-20 ENCOUNTER — Ambulatory Visit: Payer: 59

## 2020-04-10 ENCOUNTER — Telehealth: Payer: Self-pay | Admitting: *Deleted

## 2020-04-10 NOTE — Telephone Encounter (Signed)
Lmom for pt to call us back. 

## 2020-04-24 ENCOUNTER — Ambulatory Visit: Payer: 59

## 2020-07-04 ENCOUNTER — Ambulatory Visit: Payer: 59

## 2020-08-14 ENCOUNTER — Other Ambulatory Visit: Payer: Self-pay | Admitting: Family Medicine

## 2020-08-14 DIAGNOSIS — Z1231 Encounter for screening mammogram for malignant neoplasm of breast: Secondary | ICD-10-CM

## 2020-09-05 ENCOUNTER — Ambulatory Visit: Payer: Self-pay

## 2020-09-24 ENCOUNTER — Other Ambulatory Visit: Payer: Self-pay

## 2020-09-24 ENCOUNTER — Ambulatory Visit
Admission: RE | Admit: 2020-09-24 | Discharge: 2020-09-24 | Disposition: A | Discharge status: Home / Self Care | Payer: No Typology Code available for payment source | Source: Ambulatory Visit | Attending: Family Medicine | Admitting: Family Medicine

## 2020-09-24 DIAGNOSIS — Z1231 Encounter for screening mammogram for malignant neoplasm of breast: Secondary | ICD-10-CM

## 2020-09-28 ENCOUNTER — Ambulatory Visit: Payer: 59 | Admitting: Family Medicine

## 2020-10-18 ENCOUNTER — Other Ambulatory Visit: Payer: Self-pay

## 2020-10-18 ENCOUNTER — Ambulatory Visit (INDEPENDENT_AMBULATORY_CARE_PROVIDER_SITE_OTHER): Payer: Self-pay | Admitting: *Deleted

## 2020-10-18 VITALS — Ht 65.0 in | Wt 267.2 lb

## 2020-10-18 DIAGNOSIS — Z1211 Encounter for screening for malignant neoplasm of colon: Secondary | ICD-10-CM

## 2020-10-18 MED ORDER — NA SULFATE-K SULFATE-MG SULF 17.5-3.13-1.6 GM/177ML PO SOLN: 1.0000 | Freq: Once | ORAL | 0 refills | Status: AC

## 2020-10-18 NOTE — Progress Notes (Signed)
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

## 2020-10-18 NOTE — Progress Notes (Signed)
Gastroenterology Pre-Procedure Review  Request Date:  10/18/2020 Requesting Physician: Dr. Doylene Canard, no previous TCS  PATIENT REVIEW QUESTIONS: The patient responded to the following health history questions as indicated:    1. Diabetes Melitis: no 2. Joint replacements in the past 12 months: no 3. Major health problems in the past 3 months: no 4. Has an artificial valve or MVP: no 5. Has a defibrillator: no 6. Has been advised in past to take antibiotics in advance of a procedure like teeth cleaning: no 7. Family history of colon cancer: no  8. Alcohol Use: no 9. Illicit drug Use: no 10. History of sleep apnea: no  11. History of coronary artery or other vascular stents placed within the last 12 months: no 12. History of any prior anesthesia complications: no 13. Body mass index is 44.46 kg/m.    MEDICATIONS & ALLERGIES:    Patient reports the following regarding taking any blood thinners:   Plavix? no Aspirin? no Coumadin? no Brilinta? no Xarelto? no Eliquis? no Pradaxa? no Savaysa? no Effient? no   Patient confirms/reports the following medications:  Current Outpatient Medications  Medication Sig Dispense Refill  . Multiple Vitamin (MULTI-VITAMIN DAILY PO) Take 1 tablet by mouth every other day. Takes daily, soft chew tablet.     . pantoprazole (PROTONIX) 40 MG tablet Take 1 tablet (40 mg total) by mouth daily. 30 tablet 5   No current facility-administered medications for this visit.    Patient confirms/reports the following allergies:  Allergies  Allergen Reactions  . Chlorhexidine Itching    No orders of the defined types were placed in this encounter.   AUTHORIZATION INFORMATION Primary Insurance: PHCS,  ID [id number] ,  Group #: [id number] Pre-Cert / Berkley Harvey required:  Pre-Cert / Auth #:   SCHEDULE INFORMATION: Procedure has been scheduled as follows:  Date: , Time:  Location:   This Gastroenterology Pre-Precedure Review Form is being routed to  the following provider(s): Ermalinda Memos, [place]

## 2020-10-19 NOTE — Progress Notes (Signed)
ASA III due to BMI. Will need OV to schedule.

## 2020-10-19 NOTE — Progress Notes (Signed)
Called pt and made her aware that she will need ov with provider to arrange TCS.  Pt informed me that she would need to call me back after checking her work schedule.

## 2020-10-22 NOTE — Progress Notes (Signed)
Pt scheduled ov for 10/31/2020 at 9:30 with Dr. Marletta Lor.

## 2020-10-31 ENCOUNTER — Other Ambulatory Visit: Payer: Self-pay

## 2020-10-31 ENCOUNTER — Telehealth: Payer: Self-pay | Admitting: *Deleted

## 2020-10-31 ENCOUNTER — Ambulatory Visit (INDEPENDENT_AMBULATORY_CARE_PROVIDER_SITE_OTHER): Payer: No Typology Code available for payment source | Admitting: Internal Medicine

## 2020-10-31 ENCOUNTER — Encounter: Payer: Self-pay | Admitting: *Deleted

## 2020-10-31 ENCOUNTER — Encounter: Payer: Self-pay | Admitting: Internal Medicine

## 2020-10-31 VITALS — BP 142/80 | HR 65 | Temp 96.9°F | Ht 65.0 in | Wt 268.4 lb

## 2020-10-31 DIAGNOSIS — Z1211 Encounter for screening for malignant neoplasm of colon: Secondary | ICD-10-CM | POA: Insufficient documentation

## 2020-10-31 DIAGNOSIS — K219 Gastro-esophageal reflux disease without esophagitis: Secondary | ICD-10-CM | POA: Diagnosis not present

## 2020-10-31 MED ORDER — PANTOPRAZOLE SODIUM 40 MG PO TBEC: 40.0000 mg | DELAYED_RELEASE_TABLET | Freq: Every day | ORAL | 3 refills | Status: DC

## 2020-10-31 NOTE — Patient Instructions (Addendum)
We will schedule you for screening colonoscopy. For your chronic reflux I refilled your pantoprazole and sent a year supply to your pharmacy.  Lifestyle and home remedies TO MANAGE REFLUX/HEARTBURN    You may eliminate or reduce the frequency of heartburn by making the following lifestyle changes:    Control your weight. Being overweight is a major risk factor for heartburn and GERD. Excess pounds put pressure on your abdomen, pushing up your stomach and causing acid to back up into your esophagus.     Eat smaller meals. 4 TO 6 MEALS A DAY. This reduces pressure on the lower esophageal sphincter, helping to prevent the valve from opening and acid from washing back into your esophagus.      Loosen your belt. Clothes that fit tightly around your waist put pressure on your abdomen and the lower esophageal sphincter.      Eliminate heartburn triggers. Everyone has specific triggers. Common triggers such as fatty or fried foods, spicy food, tomato sauce, carbonated beverages, alcohol, chocolate, mint, garlic, onion, caffeine and nicotine may make heartburn worse.     Avoid stooping or bending. Tying your shoes is OK. Bending over for longer periods to weed your garden isn't, especially soon after eating.     Don't lie down after a meal. Wait at least three to four hours after eating before going to bed, and don't lie down right after eating.    At [facility] we value your feedback. You may receive a survey about your visit today. Please share your experience as we strive to create trusting relationships with our patients to provide genuine, compassionate, quality care.  We appreciate your understanding and patience as we review any laboratory studies, imaging, and other diagnostic tests that are ordered as we care for you. Our office policy is 5 business days for review of these results, and any emergent or urgent results are addressed in a timely manner for your best  interest. If you do not hear from our office in 1 week, please contact us.   We also encourage the use of MyChart, which contains your medical information for your review as well. If you are not enrolled in this feature, an access code is on this after visit summary for your convenience. Thank you for allowing [place] to be involved in your care.  It was great to see you today!  I hope you have a great rest of your winter!!   Hennie Duos. Marletta Lor, D.O. Gastroenterology and Hepatology [facility]

## 2020-10-31 NOTE — Progress Notes (Signed)
Primary Care Physician:  Raliegh Ip, DO Primary Gastroenterologist:  Dr. Marletta Lor  Chief Complaint  Patient presents with  . Colonoscopy    never had tcs  . Gastroesophageal Reflux    occ, out of Protonix, wants refill    HPI:   Krista Fowler is a 49 y.o. female who presents to the clinic today by referral from her PCP Dr. Nadine Counts to discuss screening colonoscopy. Patient has never undergone colonoscopy in the past. Denies any family history of colorectal malignancy. No unintentional weight loss. No melena or hematochezia. No abdominal pain. She does have chronic reflux for which he takes pantoprazole 40 mg daily and is requesting refills today. States this is well controlled. Notes having an EGD 2 to 3 years ago which she states was unremarkable. No history of PUD or H. pylori. No chronic NSAID use. No dysphagia or odynophagia. Otherwise she has no other complaints.  Past Medical History:  Diagnosis Date  . GERD (gastroesophageal reflux disease)   . History of hiatal hernia   . Urticaria     Past Surgical History:  Procedure Laterality Date  . ABDOMINAL HYSTERECTOMY     partial  . LAPAROSCOPIC GASTRIC SLEEVE RESECTION N/A 07/20/2017   Procedure: LAPAROSCOPIC GASTRIC SLEEVE RESECTION, UPPER ENDO;  Surgeon: Kinsinger, De Blanch, MD;  Location: [facility];  Service: General;  Laterality: N/A;  . TUBAL LIGATION      Current Outpatient Medications  Medication Sig Dispense Refill  . Multiple Vitamin (MULTI-VITAMIN DAILY PO) Take 1 tablet by mouth every other day. Takes daily, soft chew tablet.     . pantoprazole (PROTONIX) 40 MG tablet Take 1 tablet (40 mg total) by mouth daily. (Patient not taking: Reported on 10/31/2020) 30 tablet 5   No current facility-administered medications for this visit.    Allergies as of 10/31/2020 - Review Complete 10/31/2020  Allergen Reaction Noted  . Chlorhexidine Itching 07/20/2017    Family History  Problem Relation Age of Onset   . Hypertension Mother   . Arthritis Mother   . Diabetes Father   . Hypertension Father   . Hypertension Brother   . Leukemia Maternal Grandmother   . Hypertension Maternal Grandmother   . Diabetes Paternal Grandmother   . Cancer Other   . Stroke Other   . Heart disease Other     Social History   Socioeconomic History  . Marital status: Single    Spouse name: Not on file  . Number of children: 2  . Years of education: Not on file  . Highest education level: Not on file  Occupational History  . Not on file  Tobacco Use  . Smoking status: Never Smoker  . Smokeless tobacco: Never Used  Vaping Use  . Vaping Use: Never used  Substance and Sexual Activity  . Alcohol use: No  . Drug use: No  . Sexual activity: Yes    Birth control/protection: Surgical  Other Topics Concern  . 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   Social Determinants of Health   Financial Resource Strain:   . Difficulty of Paying Living Expenses: Not on file  Food Insecurity:   . Worried About Programme researcher, broadcasting/film/video in the Last Year: Not on file  . Ran Out of Food in the Last Year: Not on file  Transportation Needs:   .  Lack of Transportation (Medical): Not on file  . Lack of Transportation (Non-Medical): Not on file  Physical Activity:   . Days of Exercise per Week: Not on file  . Minutes of Exercise per Session: Not on file  Stress:   . Feeling of Stress : Not on file  Social Connections:   . Frequency of Communication with Friends and Family: Not on file  . Frequency of Social Gatherings with Friends and Family: Not on file  . Attends Religious Services: Not on file  . Active Member of Clubs or Organizations: Not on file  . Attends Banker Meetings: Not on file  . Marital Status: Not on file  Intimate Partner Violence:   . Fear of Current or Ex-Partner: Not on  file  . Emotionally Abused: Not on file  . Physically Abused: Not on file  . Sexually Abused: Not on file    Subjective: Review of Systems  Constitutional: Negative for chills and fever.  HENT: Negative for congestion and hearing loss.   Eyes: Negative for blurred vision and double vision.  Respiratory: Negative for cough and shortness of breath.   Cardiovascular: Negative for chest pain and palpitations.  Gastrointestinal: Positive for heartburn. Negative for abdominal pain, blood in stool, constipation, diarrhea, melena and vomiting.  Genitourinary: Negative for dysuria and urgency.  Musculoskeletal: Negative for joint pain and myalgias.  Skin: Negative for itching and rash.  Neurological: Negative for dizziness and headaches.  Psychiatric/Behavioral: Negative for depression. The patient is not nervous/anxious.        Objective: BP (!) 142/80   Pulse 65   Temp (!) 96.9 F (36.1 C) (Temporal)   Ht 5\' 5"  (1.651 m)   Wt 268 lb 6.4 oz (121.7 kg)   BMI 44.66 kg/m  Physical Exam Constitutional:      Appearance: Normal appearance. She is obese.  HENT:     Head: Normocephalic and atraumatic.  Eyes:     Extraocular Movements: Extraocular movements intact.     Conjunctiva/sclera: Conjunctivae normal.  Cardiovascular:     Rate and Rhythm: Normal rate and regular rhythm.  Pulmonary:     Effort: Pulmonary effort is normal.     Breath sounds: Normal breath sounds.  Abdominal:     General: Bowel sounds are normal.     Palpations: Abdomen is soft.  Musculoskeletal:        General: No swelling. Normal range of motion.     Cervical back: Normal range of motion and neck supple.  Skin:    General: Skin is warm and dry.     Coloration: Skin is not jaundiced.  Neurological:     General: No focal deficit present.     Mental Status: She is alert and oriented to person, place, and time.  Psychiatric:        Mood and Affect: Mood normal.        Behavior: Behavior normal.       Assessment: *Chronic GERD-well-controlled on pantoprazole *Colon cancer screening  Plan: Patient's GERD appears to be well controlled on pantoprazole. We'll refill today and sent in a year supply. We'll also print home practices to decrease reflux symptoms.  Discussed colon cancer screening in depth with patient today. Will schedule for screening colonoscopy.The risks including infection, bleed, or perforation as well as benefits, limitations, alternatives and imponderables have been reviewed with the patient. Questions have been answered. All parties agreeable.  Thank you Dr. for the kind referral.   10/31/2020 9:50 AM  Disclaimer: This note was dictated with voice recognition software. Similar sounding words can inadvertently be transcribed and may not be corrected upon review.

## 2020-10-31 NOTE — Telephone Encounter (Signed)
Called multi plan and spoke with [place]. No PA is required for procedure.

## 2020-11-19 ENCOUNTER — Ambulatory Visit: Payer: Self-pay | Admitting: Family Medicine

## 2020-12-21 NOTE — Patient Instructions (Signed)
Krista Fowler  12/21/2020     @PREFPERIOPPHARMACY @   Your procedure is scheduled on  12/25/2020.   Report to 12/27/2020 at  [phone]  A.M.   Call this number if you have problems the morning of surgery:  [phone]   Remember:  Follow the diet and prep instructions given to you by the office.                      Take these medicines the morning of surgery with A SIP OF WATER  Protonix.    Please brush your teeth.  Do not wear jewelry, make-up or nail polish.  Do not wear lotions, powders, or perfumes, or deodorant.  Do not shave 48 hours prior to surgery.  Men may shave face and neck.  Do not bring valuables to the hospital.  [facility] is not responsible for any belongings or valuables.  Contacts, dentures or bridgework may not be worn into surgery.  Leave your suitcase in the car.  After surgery it may be brought to your room.  For patients admitted to the hospital, discharge time will be determined by your treatment team.  Patients discharged the day of surgery will not be allowed to drive home and will need someone with them for 24 hours.   Special instructions:  DO NOT smoke (tobacco or vape) the morning of your procedure.  Please read over the following fact sheets that you were given. Anesthesia Post-op Instructions and Care and Recovery After Surgery       Colonoscopy, Adult, Care After This sheet gives you information about how to care for yourself after your procedure. Your health care provider may also give you more specific instructions. If you have problems or questions, contact your health care provider. What can I expect after the procedure? After the procedure, it is common to have:  A small amount of blood in your stool for 24 hours after the procedure.  Some gas.  Mild cramping or bloating of your abdomen. Follow these instructions at home: Eating and drinking  Drink enough fluid to keep your urine pale yellow.  Follow  instructions from your health care provider about eating or drinking restrictions.  Resume your normal diet as instructed by your health care provider. Avoid heavy or fried foods that are hard to digest.   Activity  Rest as told by your health care provider.  Avoid sitting for a long time without moving. Get up to take short walks every 1-2 hours. This is important to improve blood flow and breathing. Ask for help if you feel weak or unsteady.  Return to your normal activities as told by your health care provider. Ask your health care provider what activities are safe for you. Managing cramping and bloating  Try walking around when you have cramps or feel bloated.  Apply heat to your abdomen as told by your health care provider. Use the heat source that your health care provider recommends, such as a moist heat pack or a heating pad. ? Place a towel between your skin and the heat source. ? Leave the heat on for 20-30 minutes. ? Remove the heat if your skin turns bright red. This is especially important if you are unable to feel pain, heat, or cold. You may have a greater risk of getting burned.   General instructions  If you were given a sedative during the procedure, it can affect you for several  hours. Do not drive or operate machinery until your health care provider says that it is safe.  For the first 24 hours after the procedure: ? Do not sign important documents. ? Do not drink alcohol. ? Do your regular daily activities at a slower pace than normal. ? Eat soft foods that are easy to digest.  Take over-the-counter and prescription medicines only as told by your health care provider.  Keep all follow-up visits as told by your health care provider. This is important. Contact a health care provider if:  You have blood in your stool 2-3 days after the procedure. Get help right away if you have:  More than a small spotting of blood in your stool.  Large blood clots in your  stool.  Swelling of your abdomen.  Nausea or vomiting.  A fever.  Increasing pain in your abdomen that is not relieved with medicine. Summary  After the procedure, it is common to have a small amount of blood in your stool. You may also have mild cramping and bloating of your abdomen.  If you were given a sedative during the procedure, it can affect you for several hours. Do not drive or operate machinery until your health care provider says that it is safe.  Get help right away if you have a lot of blood in your stool, nausea or vomiting, a fever, or increased pain in your abdomen. This information is not intended to replace advice given to you by your health care provider. Make sure you discuss any questions you have with your health care provider. Document Revised: 11/11/2019 Document Reviewed: 06/13/2019 Elsevier Patient Education  2021 [place] After This sheet gives you information about how to care for yourself after your procedure. Your health care provider may also give you more specific instructions. If you have problems or questions, contact your health care provider. What can I expect after the procedure? After the procedure, it is common to have:  Tiredness.  Forgetfulness about what happened after the procedure.  Impaired judgment for important decisions.  Nausea or vomiting.  Some difficulty with balance. Follow these instructions at home: For the time period you were told by your health care provider:  Rest as needed.  Do not participate in activities where you could fall or become injured.  Do not drive or use machinery.  Do not drink alcohol.  Do not take sleeping pills or medicines that cause drowsiness.  Do not make important decisions or sign legal documents.  Do not take care of children on your own.      Eating and drinking  Follow the diet that is recommended by your health care provider.  Drink  enough fluid to keep your urine pale yellow.  If you vomit: ? Drink water, juice, or soup when you can drink without vomiting. ? Make sure you have little or no nausea before eating solid foods. General instructions  Have a responsible adult stay with you for the time you are told. It is important to have someone help care for you until you are awake and alert.  Take over-the-counter and prescription medicines only as told by your health care provider.  If you have sleep apnea, surgery and certain medicines can increase your risk for breathing problems. Follow instructions from your health care provider about wearing your sleep device: ? Anytime you are sleeping, including during daytime naps. ? While taking prescription pain medicines, sleeping medicines, or medicines that make you  drowsy.  Avoid smoking.  Keep all follow-up visits as told by your health care provider. This is important. Contact a health care provider if:  You keep feeling nauseous or you keep vomiting.  You feel light-headed.  You are still sleepy or having trouble with balance after 24 hours.  You develop a rash.  You have a fever.  You have redness or swelling around the IV site. Get help right away if:  You have trouble breathing.  You have new-onset confusion at home. Summary  For several hours after your procedure, you may feel tired. You may also be forgetful and have poor judgment.  Have a responsible adult stay with you for the time you are told. It is important to have someone help care for you until you are awake and alert.  Rest as told. Do not drive or operate machinery. Do not drink alcohol or take sleeping pills.  Get help right away if you have trouble breathing, or if you suddenly become confused. This information is not intended to replace advice given to you by your health care provider. Make sure you discuss any questions you have with your health care provider. Document Revised:  08/02/2020 Document Reviewed: 10/20/2019 Elsevier Patient Education  2021 [employer].

## 2020-12-21 NOTE — Pre-Procedure Instructions (Signed)
Called patient to do pre-op phone call. She states she works at a [employer] and tested positive for COVID 12/20/2020. She will notify office on Monday. I sent interoffice message to [facility] that patient will need to be rescheduled.

## 2020-12-24 ENCOUNTER — Telehealth: Payer: Self-pay | Admitting: *Deleted

## 2020-12-24 ENCOUNTER — Other Ambulatory Visit (HOSPITAL_COMMUNITY): Payer: No Typology Code available for payment source

## 2020-12-24 ENCOUNTER — Encounter (HOSPITAL_COMMUNITY)
Admission: RE | Admit: 2020-12-24 | Discharge: 2020-12-24 | Disposition: A | Discharge status: Home / Self Care | Payer: No Typology Code available for payment source | Source: Ambulatory Visit | Attending: Internal Medicine | Admitting: Internal Medicine

## 2020-12-24 ENCOUNTER — Encounter (HOSPITAL_COMMUNITY): Payer: Self-pay

## 2020-12-24 NOTE — Telephone Encounter (Signed)
Called pt and made aware will call once we receive march schedule to r/s.

## 2020-12-24 NOTE — Telephone Encounter (Signed)
-----   Message from Elsie Amis, RN sent at 12/21/2020  3:11 PM EST ----- Regarding: + COVID [name]! I just spoke with Krista Fowler on the phone and she found out 12/20/2020 that she is COVID positive. She will be calling you guys to let you know. She will need to be rescheduled for a later date after she is finished with her quarantine. Thank you!

## 2020-12-25 ENCOUNTER — Encounter (HOSPITAL_COMMUNITY): Admission: RE | Payer: Self-pay | Source: Home / Self Care

## 2020-12-25 ENCOUNTER — Ambulatory Visit (HOSPITAL_COMMUNITY): Admission: RE | Admit: 2020-12-25 | Payer: No Typology Code available for payment source | Source: Home / Self Care

## 2020-12-25 SURGERY — COLONOSCOPY WITH PROPOFOL
Anesthesia: Monitor Anesthesia Care

## 2020-12-26 NOTE — Telephone Encounter (Signed)
LMVOM for pt

## 2021-01-01 ENCOUNTER — Encounter: Payer: Self-pay | Admitting: Family Medicine

## 2021-01-01 ENCOUNTER — Ambulatory Visit: Payer: Self-pay | Admitting: Family Medicine

## 2021-01-01 NOTE — Progress Notes (Deleted)
Subjective: CC:*** PCP: Raliegh Ip, DO NLG:[id number] Jesika Men is a 50 y.o. female presenting to clinic today for:  1. ***   ROS: Per HPI  Allergies  Allergen Reactions  . Chlorhexidine Itching   Past Medical History:  Diagnosis Date  . GERD (gastroesophageal reflux disease)   . History of hiatal hernia   . Urticaria     Current Outpatient Medications:  .  acetaminophen (TYLENOL) 500 MG tablet, Take 500-1,000 mg by mouth every 6 (six) hours as needed (pain.)., Disp: , Rfl:  .  CALCIUM PO, Take 1 tablet by mouth daily., Disp: , Rfl:  .  Multiple Vitamins-Minerals (BARIATRIC MULTIVITAMINS/IRON PO), Take 1 tablet by mouth daily., Disp: , Rfl:  .  pantoprazole (PROTONIX) 40 MG tablet, Take 1 tablet (40 mg total) by mouth daily., Disp: 90 tablet, Rfl: 3 Social History   Socioeconomic History  . Marital status: Single    Spouse name: Not on file  . Number of children: 2  . Years of education: Not on file  . Highest education level: Not on file  Occupational History  . Not on file  Tobacco Use  . Smoking status: Never Smoker  . Smokeless tobacco: Never Used  Vaping Use  . Vaping Use: Never used  Substance and Sexual Activity  . Alcohol use: No  . Drug use: No  . Sexual activity: Yes    Birth control/protection: Surgical  Other Topics Concern  . 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   Social Determinants of Health   Financial Resource Strain: Not on file  Food Insecurity: Not on file  Transportation Needs: Not on file  Physical Activity: Not on file  Stress: Not on file  Social Connections: Not on file  Intimate Partner Violence: Not on file   Family History  Problem Relation Age of Onset  . Hypertension Mother   . Arthritis Mother   . Diabetes Father   . Hypertension Father   . Hypertension Brother   . Leukemia Maternal  Grandmother   . Hypertension Maternal Grandmother   . Diabetes Paternal Grandmother   . Cancer Other   . Stroke Other   . Heart disease Other     Objective: Office vital signs reviewed. There were no vitals taken for this visit.  Physical Examination:  General: Awake, alert, *** nourished, No acute distress HEENT: Normal    Neck: No masses palpated. No lymphadenopathy    Ears: Tympanic membranes intact, normal light reflex, no erythema, no bulging    Eyes: PERRLA, extraocular membranes intact, sclera ***    Nose: nasal turbinates moist, *** nasal discharge    Throat: moist mucus membranes, no erythema, *** tonsillar exudate.  Airway is patent Cardio: regular rate and rhythm, S1S2 heard, no murmurs appreciated Pulm: clear to auscultation bilaterally, no wheezes, rhonchi or rales; normal work of breathing on room air GI: soft, non-tender, non-distended, bowel sounds present x4, no hepatomegaly, no splenomegaly, no masses GU: external vaginal tissue ***, cervix ***, *** punctate lesions on cervix appreciated, *** discharge from cervical os, *** bleeding, *** cervical motion tenderness, *** abdominal/ adnexal masses Extremities: warm, well perfused, No edema, cyanosis or clubbing; +*** pulses bilaterally MSK: *** gait and *** station Skin: dry; intact; no rashes or lesions Neuro: *** Strength and light touch sensation grossly intact, *** DTRs ***/4  Assessment/ Plan: 50 y.o. female   ***  No orders of the defined types were placed in this encounter.  No orders of the defined types were placed in this encounter.    Raliegh Ip, DO [facility] [phone]

## 2021-01-02 NOTE — Telephone Encounter (Signed)
Letter mailed

## 2021-02-06 ENCOUNTER — Encounter (HOSPITAL_COMMUNITY): Payer: Self-pay | Admitting: *Deleted

## 2021-11-06 ENCOUNTER — Other Ambulatory Visit: Payer: Self-pay | Admitting: Family Medicine

## 2021-11-06 DIAGNOSIS — Z1231 Encounter for screening mammogram for malignant neoplasm of breast: Secondary | ICD-10-CM

## 2021-12-13 ENCOUNTER — Ambulatory Visit
Admission: RE | Admit: 2021-12-13 | Discharge: 2021-12-13 | Disposition: A | Discharge status: Home / Self Care | Payer: BC Managed Care – PPO | Source: Ambulatory Visit | Attending: Family Medicine | Admitting: Family Medicine

## 2021-12-13 DIAGNOSIS — Z1231 Encounter for screening mammogram for malignant neoplasm of breast: Secondary | ICD-10-CM

## 2021-12-27 ENCOUNTER — Telehealth: Payer: Self-pay | Admitting: Family Medicine

## 2021-12-27 ENCOUNTER — Encounter: Payer: Self-pay | Admitting: Family Medicine

## 2021-12-27 ENCOUNTER — Ambulatory Visit (INDEPENDENT_AMBULATORY_CARE_PROVIDER_SITE_OTHER): Payer: BC Managed Care – PPO | Admitting: Family Medicine

## 2021-12-27 VITALS — BP 138/76 | HR 74 | Temp 97.5°F | Ht 65.0 in | Wt 285.6 lb

## 2021-12-27 DIAGNOSIS — G8929 Other chronic pain: Secondary | ICD-10-CM

## 2021-12-27 DIAGNOSIS — Z0001 Encounter for general adult medical examination with abnormal findings: Secondary | ICD-10-CM

## 2021-12-27 DIAGNOSIS — K219 Gastro-esophageal reflux disease without esophagitis: Secondary | ICD-10-CM

## 2021-12-27 DIAGNOSIS — N62 Hypertrophy of breast: Secondary | ICD-10-CM

## 2021-12-27 DIAGNOSIS — M546 Pain in thoracic spine: Secondary | ICD-10-CM

## 2021-12-27 DIAGNOSIS — L304 Erythema intertrigo: Secondary | ICD-10-CM | POA: Diagnosis not present

## 2021-12-27 DIAGNOSIS — Z1211 Encounter for screening for malignant neoplasm of colon: Secondary | ICD-10-CM

## 2021-12-27 DIAGNOSIS — Z903 Acquired absence of stomach [part of]: Secondary | ICD-10-CM | POA: Diagnosis not present

## 2021-12-27 DIAGNOSIS — Z0000 Encounter for general adult medical examination without abnormal findings: Secondary | ICD-10-CM

## 2021-12-27 DIAGNOSIS — L659 Nonscarring hair loss, unspecified: Secondary | ICD-10-CM

## 2021-12-27 LAB — BAYER DCA HB A1C WAIVED: HB A1C (BAYER DCA - WAIVED): 5.7 % — ABNORMAL HIGH (ref 4.8–5.6)

## 2021-12-27 MED ORDER — INSULIN PEN NEEDLE 32G X 6 MM MISC: 3 refills | Status: DC

## 2021-12-27 MED ORDER — NYSTATIN 100000 UNIT/GM EX POWD: 1.0000 "application " | Freq: Three times a day (TID) | CUTANEOUS | 1 refills | Status: DC

## 2021-12-27 MED ORDER — PANTOPRAZOLE SODIUM 40 MG PO TBEC: 40.0000 mg | DELAYED_RELEASE_TABLET | Freq: Every day | ORAL | 3 refills | Status: DC

## 2021-12-27 MED ORDER — SAXENDA 18 MG/3ML ~~LOC~~ SOPN: PEN_INJECTOR | SUBCUTANEOUS | 0 refills | Status: AC

## 2021-12-27 NOTE — Telephone Encounter (Signed)
Key: [id number] - Rx #: [id number] Need help? Call us at [phone] Status Sent to plan Pantoprazole Sodium 40MG  dr tablets   Your request has been approved Effective from 12/27/2021 through 12/26/2022.  Pharmacy aware.

## 2021-12-27 NOTE — Patient Instructions (Addendum)
Next Pap due 03/2023. Cologuard ordered.  Please return ASAP  You had labs performed today.  You will be contacted with the results of the labs once they are available, usually in the next 3 business days for routine lab work.  If you have an active my chart account, they will be released to your MyChart.  If you prefer to have these labs released to you via telephone, please let us know.  If you had a pap smear or biopsy performed, expect to be contacted in about 7-10 days.  Preventive Care 14-15 Years Old, Female Preventive care refers to lifestyle choices and visits with your health care provider that can promote health and wellness. Preventive care visits are also called wellness exams. What can I expect for my preventive care visit? Counseling Your health care provider may ask you questions about your: Medical history, including: Past medical problems. Family medical history. Pregnancy history. Current health, including: Menstrual cycle. Method of birth control. Emotional well-being. Home life and relationship well-being. Sexual activity and sexual health. Lifestyle, including: Alcohol, nicotine or tobacco, and drug use. Access to firearms. Diet, exercise, and sleep habits. Work and work Statistician. Sunscreen use. Safety issues such as seatbelt and bike helmet use. Physical exam Your health care provider will check your: Height and weight. These may be used to calculate your BMI (body mass index). BMI is a measurement that tells if you are at a healthy weight. Waist circumference. This measures the distance around your waistline. This measurement also tells if you are at a healthy weight and may help predict your risk of certain diseases, such as type 2 diabetes and high blood pressure. Heart rate and blood pressure. Body temperature. Skin for abnormal spots. What immunizations do I need? Vaccines are usually given at various ages, according to a schedule. Your health care  provider will recommend vaccines for you based on your age, medical history, and lifestyle or other factors, such as travel or where you work. What tests do I need? Screening Your health care provider may recommend screening tests for certain conditions. This may include: Lipid and cholesterol levels. Diabetes screening. This is done by checking your blood sugar (glucose) after you have not eaten for a while (fasting). Pelvic exam and Pap test. Hepatitis B test. Hepatitis C test. HIV (human immunodeficiency virus) test. STI (sexually transmitted infection) testing, if you are at risk. Lung cancer screening. Colorectal cancer screening. Mammogram. Talk with your health care provider about when you should start having regular mammograms. This may depend on whether you have a family history of breast cancer. BRCA-related cancer screening. This may be done if you have a family history of breast, ovarian, tubal, or peritoneal cancers. Bone density scan. This is done to screen for osteoporosis. Talk with your health care provider about your test results, treatment options, and if necessary, the need for more tests. Follow these instructions at home: Eating and drinking  Eat a diet that includes fresh fruits and vegetables, whole grains, lean protein, and low-fat dairy products. Take vitamin and mineral supplements as recommended by your health care provider. Do not drink alcohol if: Your health care provider tells you not to drink. You are pregnant, may be pregnant, or are planning to become pregnant. If you drink alcohol: Limit how much you have to 0-1 drink a day. Know how much alcohol is in your drink. In the [place] one drink equals one 12 oz bottle of beer (355 mL), one 5 oz glass of wine (  148 mL), or one 1 oz glass of hard liquor (44 mL). Lifestyle Brush your teeth every morning and night with fluoride toothpaste. Floss one time each day. Exercise for at least 30 minutes 5 or more days  each week. Do not use any products that contain nicotine or tobacco. These products include cigarettes, chewing tobacco, and vaping devices, such as e-cigarettes. If you need help quitting, ask your health care provider. Do not use drugs. If you are sexually active, practice safe sex. Use a condom or other form of protection to prevent STIs. If you do not wish to become pregnant, use a form of birth control. If you plan to become pregnant, see your health care provider for a prepregnancy visit. Take aspirin only as told by your health care provider. Make sure that you understand how much to take and what form to take. Work with your health care provider to find out whether it is safe and beneficial for you to take aspirin daily. Find healthy ways to manage stress, such as: Meditation, yoga, or listening to music. Journaling. Talking to a trusted person. Spending time with friends and family. Minimize exposure to UV radiation to reduce your risk of skin cancer. Safety Always wear your seat belt while driving or riding in a vehicle. Do not drive: If you have been drinking alcohol. Do not ride with someone who has been drinking. When you are tired or distracted. While texting. If you have been using any mind-altering substances or drugs. Wear a helmet and other protective equipment during sports activities. If you have firearms in your house, make sure you follow all gun safety procedures. Seek help if you have been physically or sexually abused. What's next? Visit your health care provider once a year for an annual wellness visit. Ask your health care provider how often you should have your eyes and teeth checked. Stay up to date on all vaccines. This information is not intended to replace advice given to you by your health care provider. Make sure you discuss any questions you have with your health care provider. Document Revised: 05/15/2021 Document Reviewed: 05/15/2021 Elsevier Patient  Education  Krista Fowler.

## 2021-12-27 NOTE — Progress Notes (Signed)
Angala Cina Klumpp is a 51 y.o. female presents to office today for annual physical exam examination.    Concerns today include: 1.  Morbid obesity Patient has history of gastric sleeve 5 years ago.  She was able to get down to 240 pounds with that surgery but has regained quite a bit of weight since.  She would be interested in pursuing oral or injection therapy.  She is currently trying to change diet, increase physical exercise.  She does note that her obesity is affecting her low back and causing radicular symptoms in her leg.  She reports rash underneath her abdomen that develops due to her pannus and asks for Nystop powder today.  GERD has been flaring.  Using OTC nexium to control symptoms since her Protonix was out of RFs. No GI bleeding, nausea, vomiting.  2.  Macromastia Patient continues to struggle with very large breasts.  It causes upper back pain, shoulder pain and indentation on her shoulders from her bra.  She is not even sure what size bra she wears but last she checked she was a 44 double D.  She is feeling out of this bra however.  Very interested in surgical intervention and would like referral for that  3.  Alopecia Patient reports that about 2 years following her surgery she pretty much lost the hair on the top of her head.  She uses wigs to cover this up but is very self-conscious about this.  She would like to see Dr. Leonie Green in [facility] for consultation  Occupation: works  Diet: carb and salt restricting, Exercise: walking Last eye exam: UTD Last dental exam: UTD Last colonoscopy: wants cologuard Last mammogram: UTD Last pap smear: UTD Refills needed today: all Immunizations needed: Immunization History  Administered Date(s) Administered   Influenza,inj,Quad PF,6+ Mos 10/09/2015, 08/31/2016   Influenza-Unspecified 10/31/2014, 10/19/2020   Td 08/29/2014     Past Medical History:  Diagnosis Date   GERD (gastroesophageal reflux disease)    History of  hiatal hernia    Urticaria    Social History   Socioeconomic History   Marital status: Single    Spouse name: Not on file   Number of children: 2   Years of education: Not on file   Highest education level: Not on file  Occupational History   Not on file  Tobacco Use   Smoking status: Never   Smokeless tobacco: Never  Vaping Use   Vaping Use: Never used  Substance and Sexual Activity   Alcohol use: No   Drug use: No   Sexual activity: Yes    Birth control/protection: Surgical  Other Topics Concern   Not on file  Social History Narrative   Works in Conservation officer, 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   Social Determinants of Health   Financial Resource Strain: Not on file  Food Insecurity: Not on file  Transportation Needs: Not on file  Physical Activity: Not on file  Stress: Not on file  Social Connections: Not on file  Intimate Partner Violence: Not on file   Past Surgical History:  Procedure Laterality Date   ABDOMINAL HYSTERECTOMY     partial   LAPAROSCOPIC GASTRIC SLEEVE RESECTION N/A 07/20/2017   Procedure: Gage, UPPER ENDO;  Surgeon: Kieth Brightly Arta Bruce, MD;  Location: [facility];  Service: General;  Laterality: N/A;   TUBAL LIGATION     Family  History  Problem Relation Age of Onset   Hypertension Mother    Arthritis Mother    Diabetes Father    Hypertension Father    Hypertension Brother    Leukemia Maternal Grandmother    Hypertension Maternal Grandmother    Diabetes Paternal Grandmother    Cancer Other    Stroke Other    Heart disease Other     Current Outpatient Medications:    acetaminophen (TYLENOL) 500 MG tablet, Take 500-1,000 mg by mouth every 6 (six) hours as needed (pain.)., Disp: , Rfl:    CALCIUM PO, Take 1 tablet by mouth daily., Disp: , Rfl:    Multiple Vitamins-Minerals (BARIATRIC MULTIVITAMINS/IRON PO), Take 1 tablet by mouth daily., Disp: ,  Rfl:    pantoprazole (PROTONIX) 40 MG tablet, Take 1 tablet (40 mg total) by mouth daily., Disp: 90 tablet, Rfl: 3  Allergies  Allergen Reactions   Chlorhexidine Itching     ROS: Review of Systems Pertinent items noted in HPI and remainder of comprehensive ROS otherwise negative.    Physical exam BP 138/76    Pulse 74    Temp (!) 97.5 F (36.4 C)    Ht 5' 5" (1.651 m)    Wt 285 lb 9.6 oz (129.5 kg)    SpO2 100%    BMI 47.53 kg/m  General appearance: appears stated age, no distress, and morbidly obese Head: Normocephalic, without obvious abnormality, atraumatic Eyes: negative findings: lids and lashes normal, conjunctivae and sclerae normal, corneas clear, and pupils equal, round, reactive to light and accomodation Ears: normal TM's and external ear canals both ears Nose: Nares normal. Septum midline. Mucosa normal. No drainage or sinus tenderness. Throat: lips, mucosa, and tongue normal; teeth and gums normal Neck: no adenopathy, no carotid bruit, supple, symmetrical, trachea midline, and thyroid not enlarged, symmetric, no tenderness/mass/nodules Back: symmetric, no curvature. ROM normal. No CVA tenderness.,  She has visible indentations on bilateral shoulders with associated hyperpigmentation. Lungs: clear to auscultation bilaterally Heart: regular rate and rhythm, S1, S2 normal, no murmur, click, rub or gallop Abdomen:  obese, NT Extremities: extremities normal, atraumatic, no cyanosis or edema Pulses: 2+ and symmetric Skin: Skin color, texture, turgor normal. No rashes or lesions Lymph nodes: Cervical, supraclavicular, and axillary nodes normal. Neurologic: Grossly normal Psych: Mood stable, speech normal, affect appropriate   Assessment/ Plan: Noelle Penner here for annual physical exam.   Annual physical exam  Morbid obesity ([place]) - Plan: Lipid Panel, CMP14+EGFR, Bayer DCA Hb A1c Waived, Liraglutide -Weight Management (SAXENDA) 18 MG/3ML SOPN, Insulin Pen Needle 32G  X 6 MM MISC  History of sleeve gastrectomy - Plan: CBC, Vitamin B12, VITAMIN D 25 Hydroxy (Vit-D Deficiency, Fractures), Ferritin  Colon cancer screening - Plan: Cologuard  Macromastia - Plan: Ambulatory referral to Plastic Surgery  Chronic bilateral thoracic back pain  Alopecia - Plan: Ambulatory referral to Dermatology  Intertrigo - Plan: nystatin powder  Gastroesophageal reflux disease without esophagitis - Plan: pantoprazole (PROTONIX) 40 MG tablet  Cologuard ordered.  She knows to return this ASAP  Sugar shows prediabetes.  We will try to get Saxenda approved for her as I do think she has quite a bit of cardiovascular risk and really needs to get some weight off of her.  So far it sounds like she is rebounded since her surgery.  She understands red flag signs and symptoms with this medication but there were no apparent contraindications to use of the GLP.  Given her gastric sleeve will check for  vitamin deficiency  Referral placed to plastic surgery for macromastia.  Referral placed to dermatologist in [facility] for the alopecia  Nystop powder has been ordered for her intertrigo  PPI renewed   plan to follow-up in 3 months assuming that GLP is covered  Counseled on healthy lifestyle choices, including diet (rich in fruits, vegetables and lean meats and low in salt and simple carbohydrates) and exercise (at least 30 minutes of moderate physical activity daily).    Ashly M. Lajuana Ripple, DO

## 2021-12-28 LAB — CBC
Hematocrit: 37.9 % (ref 34.0–46.6)
Hemoglobin: 12.9 g/dL (ref 11.1–15.9)
MCH: 28.3 pg (ref 26.6–33.0)
MCHC: 34 g/dL (ref 31.5–35.7)
MCV: 83 fL (ref 79–97)
Platelets: 281 10*3/uL (ref 150–450)
RBC: 4.56 x10E6/uL (ref 3.77–5.28)
RDW: 13.7 % (ref 11.7–15.4)
WBC: 6.8 10*3/uL (ref 3.4–10.8)

## 2021-12-28 LAB — CMP14+EGFR
ALT: 24 IU/L (ref 0–32)
AST: 31 IU/L (ref 0–40)
Albumin/Globulin Ratio: 1.3 (ref 1.2–2.2)
Albumin: 4.2 g/dL (ref 3.8–4.9)
Alkaline Phosphatase: 111 IU/L (ref 44–121)
BUN/Creatinine Ratio: 10 (ref 9–23)
BUN: 10 mg/dL (ref 6–24)
Bilirubin Total: 0.3 mg/dL (ref 0.0–1.2)
CO2: 25 mmol/L (ref 20–29)
Calcium: 9.7 mg/dL (ref 8.7–10.2)
Chloride: 104 mmol/L (ref 96–106)
Creatinine, Ser: 0.97 mg/dL (ref 0.57–1.00)
Globulin, Total: 3.2 g/dL (ref 1.5–4.5)
Glucose: 86 mg/dL (ref 70–99)
Potassium: 4.6 mmol/L (ref 3.5–5.2)
Sodium: 144 mmol/L (ref 134–144)
Total Protein: 7.4 g/dL (ref 6.0–8.5)
eGFR: 71 mL/min/{1.73_m2} (ref >59)

## 2021-12-28 LAB — VITAMIN D 25 HYDROXY (VIT D DEFICIENCY, FRACTURES): Vit D, 25-Hydroxy: 21.9 ng/mL — ABNORMAL LOW (ref 30.0–100.0)

## 2021-12-28 LAB — VITAMIN B12: Vitamin B-12: 319 pg/mL (ref 232–1245)

## 2021-12-28 LAB — LIPID PANEL
Chol/HDL Ratio: 2.2 ratio (ref 0.0–4.4)
Cholesterol, Total: 196 mg/dL (ref 100–199)
HDL: 89 mg/dL (ref >39)
LDL Chol Calc (NIH): 92 mg/dL (ref 0–99)
Triglycerides: 84 mg/dL (ref 0–149)
VLDL Cholesterol Cal: 15 mg/dL (ref 5–40)

## 2021-12-28 LAB — FERRITIN: Ferritin: 23 ng/mL (ref 15–150)

## 2022-01-03 ENCOUNTER — Telehealth: Payer: Self-pay

## 2022-01-03 NOTE — Telephone Encounter (Signed)
Daylan Matus (Key: [id number]) Bernie Covey 18MG pen-injectors   Form Blue Ronny Bacon Form (CB) Created 5 minutes ago Sent to Plan 2 minutes ago Plan Response 2 minutes ago Submit Clinical Questions less than a minute ago Determination Wait for Determination Please wait for Advertising account executive to return a determination.

## 2022-01-06 NOTE — Telephone Encounter (Signed)
Please inform pt

## 2022-01-06 NOTE — Telephone Encounter (Signed)
Patient aware.

## 2022-01-06 NOTE — Telephone Encounter (Signed)
Your prior authorization for Krista Fowler has been denied. RETURN TO DASHBOARD When applicable, information about how to complete an appeal for this patient will be sent to you. Please also see the determination letter provided by the payer/PBM for more information.  Message from plan: nbd   Reason for Denial: The requested service is not a covered benefit per your benefit booklet or plan documents

## 2022-01-22 ENCOUNTER — Other Ambulatory Visit: Payer: Self-pay

## 2022-01-22 ENCOUNTER — Ambulatory Visit (INDEPENDENT_AMBULATORY_CARE_PROVIDER_SITE_OTHER): Payer: BC Managed Care – PPO | Admitting: Plastic Surgery

## 2022-01-22 ENCOUNTER — Encounter: Payer: Self-pay | Admitting: Plastic Surgery

## 2022-01-22 VITALS — BP 137/85 | HR 68 | Ht 65.0 in | Wt 285.3 lb

## 2022-01-22 DIAGNOSIS — N62 Hypertrophy of breast: Secondary | ICD-10-CM

## 2022-01-22 DIAGNOSIS — M546 Pain in thoracic spine: Secondary | ICD-10-CM

## 2022-01-22 DIAGNOSIS — M545 Low back pain, unspecified: Secondary | ICD-10-CM | POA: Diagnosis not present

## 2022-01-22 DIAGNOSIS — M4004 Postural kyphosis, thoracic region: Secondary | ICD-10-CM | POA: Diagnosis not present

## 2022-01-22 NOTE — Progress Notes (Signed)
Referring Provider Raliegh Ip, DO [address] [place],  [place] [address]   CC:  Chief Complaint  Patient presents with   consult      Krista Fowler is an 51 y.o. female.  HPI: Patient presents to discuss breast reduction.  She said years of back pain neck pain shoulder grooving related to her large breast.  She tried over-the-counter medications, warm packs, cold packs and supportive bras with little relief.  She has been also getting rashes beneath her breast that been refractory to over-the-counter prescription treatments.  She is up-to-date on her mammograms and they have been normal.  No previous history of breast biopsies or procedures.  She does not smoke and is not diabetic.  She is currently a double D cup and would like to be quite a bit smaller.  Allergies  Allergen Reactions   Chlorhexidine Itching    Outpatient Encounter Medications as of 01/22/2022  Medication Sig   acetaminophen (TYLENOL) 500 MG tablet Take 500-1,000 mg by mouth every 6 (six) hours as needed (pain.).   CALCIUM PO Take 1 tablet by mouth daily.   Insulin Pen Needle 32G X 6 MM MISC UAD with saxenda   Multiple Vitamins-Minerals (BARIATRIC MULTIVITAMINS/IRON PO) Take 1 tablet by mouth daily.   nystatin powder Apply 1 application topically 3 (three) times daily.   pantoprazole (PROTONIX) 40 MG tablet Take 1 tablet (40 mg total) by mouth daily.   Liraglutide -Weight Management (SAXENDA) 18 MG/3ML SOPN Inject 0.6 mg into the skin daily for 7 days, THEN 1.2 mg daily for 7 days, THEN 1.8 mg daily for 7 days, THEN 2.4 mg daily for 7 days, THEN 3 mg daily for 7 days.   No facility-administered encounter medications on file as of 01/22/2022.     Past Medical History:  Diagnosis Date   GERD (gastroesophageal reflux disease)    History of hiatal hernia    Urticaria     Past Surgical History:  Procedure Laterality Date   ABDOMINAL HYSTERECTOMY     partial   LAPAROSCOPIC GASTRIC SLEEVE RESECTION  N/A 07/20/2017   Procedure: LAPAROSCOPIC GASTRIC SLEEVE RESECTION, UPPER ENDO;  Surgeon: Kinsinger, De Blanch, MD;  Location: [facility];  Service: General;  Laterality: N/A;   TUBAL LIGATION      Family History  Problem Relation Age of Onset   Hypertension Mother    Arthritis Mother    Diabetes Father    Hypertension Father    Hypertension Brother    Leukemia Maternal Grandmother    Hypertension Maternal Grandmother    Diabetes Paternal Grandmother    Cancer Other    Stroke Other    Heart disease Other     Social History   Social History Narrative   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     Review of Systems General: Denies fevers, chills, weight loss CV: Denies chest pain, shortness of breath, palpitations  Physical Exam Vitals with BMI 01/22/2022 12/27/2021 12/27/2021  Height 5\' 5"  - 5\' 5"   Weight 285 lbs 5 oz - 285 lbs 10 oz  BMI 47.48 - 47.53  Systolic 137 138  Diastolic 85 76 79  Pulse 68 - 74    General:  No acute distress,  Alert and oriented, Non-Toxic, Normal speech and affect Breast: She has grade 3 ptosis.  Sternal notch to nipple is 38 cm on the  right and 39 cm on the left.  No obvious scars or masses.  Assessment/Plan The patient has bilateral symptomatic macromastia.  She is a good candidate for a breast reduction.  She is interested in pursuing surgical treatment.  She has tried supportive garments and fitted bras with no relief.  The details of breast reduction surgery were discussed.  I explained the procedure in detail along the with the expected scars.  The risks were discussed in detail and include bleeding, infection, damage to surrounding structures, need for additional procedures, nipple loss, change in nipple sensation, persistent pain, contour irregularities and asymmetries.  I explained that breast feeding is often not possible after breast reduction surgery.  We  discussed the expected postoperative course with an overall recovery period of about 1 month.  She demonstrated full understanding of all risks.  We discussed her personal risk factors that include her BMI..  The patient is interested in pursuing surgical treatment.  I anticipate approximately 1000g of tissue removed from each side.   Allena Napoleon 01/22/2022, 3:25 PM

## 2022-01-31 ENCOUNTER — Telehealth: Payer: Self-pay

## 2022-01-31 NOTE — Telephone Encounter (Signed)
Called patient to advise her insurance guidelines for 6 week of PT and 3 months of medical weight management. She will like for [place] to set up the referrals. PT can be in [place] or [place] and [facility] with [facility]. She is to call our office once she is finished with both sessions. ?

## 2022-02-03 DIAGNOSIS — Z1211 Encounter for screening for malignant neoplasm of colon: Secondary | ICD-10-CM | POA: Diagnosis not present

## 2022-02-10 LAB — COLOGUARD: COLOGUARD: NEGATIVE

## 2022-02-11 NOTE — Therapy (Signed)
?OUTPATIENT PHYSICAL THERAPY THORACOLUMBAR EVALUATION ? ? ?Patient Name: Krista Fowler ?MRN: [id number] ?DOB:[birth year], 51 y.o., female ?Today's Date: 02/11/2022 ? ? ? ?Past Medical History:  ?Diagnosis Date  ? GERD (gastroesophageal reflux disease)   ? History of hiatal hernia   ? Urticaria   ? ?Past Surgical History:  ?Procedure Laterality Date  ? ABDOMINAL HYSTERECTOMY    ? partial  ? LAPAROSCOPIC GASTRIC SLEEVE RESECTION N/A 07/20/2017  ? Procedure: LAPAROSCOPIC GASTRIC SLEEVE RESECTION, UPPER ENDO;  Surgeon: Kinsinger, Arta Bruce, MD;  Location: [facility];  Service: General;  Laterality: N/A;  ? TUBAL LIGATION    ? ?Patient Active Problem List  ? Diagnosis Date Noted  ? Colon cancer screening 10/31/2020  ? Acute diverticulitis 10/08/2015  ? Abdominal pain, left lower quadrant 10/08/2015  ? Morbid obesity ([place]) 10/08/2015  ? Esophageal reflux 01/24/2015  ? Hyperlipidemia 01/24/2015  ? Vitamin D deficiency 01/17/2015  ? ? ?PCP: Janora Norlander, DO ? ?REFERRING PROVIDER: Cindra Presume, MD ? ?REFERRING DIAG: N62 (ICD-10-CM) - Hypertrophy of breast M54.50 (ICD-10-CM) - Low back pain, unspecified M54.6 (ICD-10-CM) - Pain in thoracic spine M40.04 (ICD-10-CM) - Postural kyphosis, thoracic region  ? ?THERAPY DIAG:  ?No diagnosis found. ? ?ONSET DATE: back pain for 10 plus years ? ?SUBJECTIVE:                                                                                                                                                                                          ? ?SUBJECTIVE STATEMENT: ?Patient reports back pain constantly. 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 ?PAIN:  ?Are you having pain? Yes: NPRS scale: 3/10 ?Pain location: Left side low back and sometimes down Left leg ?Pain description: constant ache ?Aggravating factors: standing, walking, getting stiff ?Relieving factors: medication ? ? ?PRECAUTIONS: None ? ?WEIGHT BEARING RESTRICTIONS No ? ?FALLS:  ?Has patient fallen in last 6 months? No, Number  of falls: 0 ? ?LIVING ENVIRONMENT: ?Lives with: lives with their family ?Lives in: House/apartment ?Stairs: Yes; External: 3 steps; none ?Has following equipment at home: None ? ?OCCUPATION: nurse ? ?PLOF: Independent ? ?PATIENT GOALS back pain decreased with activity, bending, sit on the floor ? ? ?OBJECTIVE:  ? ?DIAGNOSTIC FINDINGS:  ?None noted ? ?PATIENT SURVEYS:  ?FOTO 38 ? ?SCREENING FOR RED FLAGS: ?Bowel or bladder incontinence: No ?Spinal tumors: No ?Cauda equina syndrome: No ?Compression fracture: No ?Abdominal aneurysm: No ? ?COGNITION: ? Overall cognitive status: Within functional limits for tasks assessed   ?  ?SENSATION: ?WFL ? ?POSTURE:  ?Forward head rounded shoulders, decreased lumbar curve ? ?PALPATION: ? mild tenderness lumbar paraspinals, piriformis bilaterally Left > Right ? ?  LUMBAR ROM:  ? ?Active  A/PROM  ?02/11/2022  ?Flexion Limited by 10%  ?Extension Limited by 25%  ?Right lateral flexion Limited by 25%  ?Left lateral flexion Limited by 25%  ?Right rotation wfl  ?Left rotation wfl  ? (Blank rows = not tested) ? ? ?LE MMT: ? ?MMT Right ?02/11/2022 Left ?02/11/2022  ?Hip flexion 5 4+ painful  ?Hip extension 4+ 4   ?Hip abduction    ?Hip adduction    ?Hip internal rotation    ?Hip external rotation    ?Knee flexion 5 5  ?Knee extension 5 5  ?Ankle dorsiflexion 5 5  ?Ankle plantarflexion    ?Ankle inversion    ?Ankle eversion    ? (Blank rows = not tested) ? ?FUNCTIONAL TESTS:  ?5 times sit to stand: 20 sec ? ?GAIT: ?Distance walked: 100 ft ?Assistive device utilized: None ?Level of assistance: Complete Independence ?Comments: slight decreased gait speed ? ? ? ?TODAY'S TREATMENT  ?Physical therapy evaluation; HEP instruction ? ? ?PATIENT EDUCATION:  ?Education details: HEP, plan of care ?Person educated: Patient ?Education method: Explanation, Tactile cues, Verbal cues, and Handouts ?Education comprehension: verbalized understanding, returned demonstration, and needs further education ? ? ?HOME  EXERCISE PROGRAM: ?Supine abdominal bracing, bridge, dead bug ? ?ASSESSMENT: ? ?CLINICAL IMPRESSION: ?Patient is a 51 y.o. female who was seen today for physical therapy evaluation and treatment for low back pain.  She states this has been ongoing for about 10 years and per her report and MD aggravated by large breasts.  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  She also reports back pain limits her activity at home and limits her ability to exercise.   ? ? ?OBJECTIVE IMPAIRMENTS Abnormal gait, decreased activity tolerance, decreased endurance, decreased knowledge of condition, decreased mobility, difficulty walking, decreased ROM, decreased strength, hypomobility, impaired perceived functional ability, impaired flexibility, improper body mechanics, postural dysfunction, and pain.  ? ?ACTIVITY LIMITATIONS cleaning, community activity, driving, meal prep, occupation, laundry, yard work, shopping, and church.  ? ?PERSONAL FACTORS Fitness are also affecting patient's functional outcome.  ? ? ?REHAB POTENTIAL: Good ? ?CLINICAL DECISION MAKING: Stable/uncomplicated ? ?EVALUATION COMPLEXITY: Low ? ? ?GOALS: ?Goals reviewed with patient? Yes ? ?SHORT TERM GOALS: Target date: 02/26/2022 ? ?Patient will be independent with initial HEP ? ?Goal status: INITIAL ? ? ?LONG TERM GOALS: Target date: 03/12/2022 ? ?Patient will report 50 % improvement in subjective symptoms to improve tolerance for all functional mobilty ? ?Goal status: INITIAL ? ?2.  Patient will improve FOTO score to predicted value to demonstrate improved functional mobility ?Baseline: 63 ?Goal status: INITIAL ? ?3.  Patient will improve Left hip Flexion and Left hip extension scores to equal to the Right to demonstrate increased lower extremity strength to navigate steps safely and be on her feet for a full shift at work ?Baseline: Left hip F 4+, Left hip ext 4 ?Goal status: INITIAL ? ?4.  Patient will be  independent with advanced HEP without complaint of back pain to demonstrate improved core strength and fitness ? ?Goal status: INITIAL ? ? ? ? ?PLAN: ?PT FREQUENCY: 1x/week ? ?PT DURATION: 4 weeks ? ?PLANNED INTERVENTIONS: Therapeutic exercises, Therapeutic activity, Neuromuscular re-education, Balance training, Gait training, Patient/Family education, Joint manipulation, Joint mobilization, Stair training, DME instructions, Aquatic Therapy, Electrical stimulation, Spinal manipulation, Spinal mobilization, Cryotherapy, Moist heat, Taping, Traction, Ultrasound, Contrast bath, Biofeedback, and Manual therapy. ? ?PLAN FOR NEXT SESSION: review HEP  and goals and assess patient reaction to HEP; progress trunk stability and core strengthening exercises.  ? ? ?12:01 PM, 02/12/22 ?Vignesh Willert Small Shaylah Mcghie MPT ?[facility] ? [phone] ?[phone] ? ? ?

## 2022-02-12 ENCOUNTER — Other Ambulatory Visit: Payer: Self-pay

## 2022-02-12 ENCOUNTER — Ambulatory Visit (HOSPITAL_COMMUNITY): Payer: BC Managed Care – PPO | Attending: Plastic Surgery

## 2022-02-12 DIAGNOSIS — M546 Pain in thoracic spine: Secondary | ICD-10-CM | POA: Diagnosis not present

## 2022-02-12 DIAGNOSIS — G8929 Other chronic pain: Secondary | ICD-10-CM | POA: Diagnosis not present

## 2022-02-12 DIAGNOSIS — M5442 Lumbago with sciatica, left side: Secondary | ICD-10-CM | POA: Diagnosis not present

## 2022-02-13 ENCOUNTER — Encounter: Payer: Self-pay | Admitting: *Deleted

## 2022-02-19 ENCOUNTER — Encounter (HOSPITAL_COMMUNITY): Payer: BC Managed Care – PPO | Admitting: Physical Therapy

## 2022-02-26 ENCOUNTER — Ambulatory Visit (HOSPITAL_COMMUNITY): Payer: BC Managed Care – PPO | Admitting: Physical Therapy

## 2022-02-26 ENCOUNTER — Other Ambulatory Visit: Payer: Self-pay

## 2022-02-26 DIAGNOSIS — G8929 Other chronic pain: Secondary | ICD-10-CM | POA: Diagnosis not present

## 2022-02-26 DIAGNOSIS — M5442 Lumbago with sciatica, left side: Secondary | ICD-10-CM | POA: Diagnosis not present

## 2022-02-26 DIAGNOSIS — M546 Pain in thoracic spine: Secondary | ICD-10-CM

## 2022-02-26 NOTE — Therapy (Signed)
?OUTPATIENT PHYSICAL THERAPY TREATMENT NOTE ? ? ?Patient Name: Krista Fowler ?MRN: [id number] ?DOB:[birth year], 51 y.o., female ?Today's Date: 03-07-22 ? ?PCP: Janora Norlander, DO ?REFERRING PROVIDER:  ? ? Cindra Presume, MD  ? ? ?Past Medical History:  ?Diagnosis Date  ? GERD (gastroesophageal reflux disease)   ? History of hiatal hernia   ? Urticaria   ? ?Past Surgical History:  ?Procedure Laterality Date  ? ABDOMINAL HYSTERECTOMY    ? partial  ? LAPAROSCOPIC GASTRIC SLEEVE RESECTION N/A 07/20/2017  ? Procedure: LAPAROSCOPIC GASTRIC SLEEVE RESECTION, UPPER ENDO;  Surgeon: Kinsinger, Arta Bruce, MD;  Location: [facility];  Service: General;  Laterality: N/A;  ? TUBAL LIGATION    ? ?Patient Active Problem List  ? Diagnosis Date Noted  ? Colon cancer screening 10/31/2020  ? Acute diverticulitis 10/08/2015  ? Abdominal pain, left lower quadrant 10/08/2015  ? Morbid obesity ([place]) 10/08/2015  ? Esophageal reflux 01/24/2015  ? Hyperlipidemia 01/24/2015  ? Vitamin D deficiency 01/17/2015  ? ?REFERRING DIAG: N62 (ICD-10-CM) - Hypertrophy of breast M54.50 (ICD-10-CM) - Low back pain, unspecified M54.6 (ICD-10-CM) - Pain in thoracic spine M40.04 (ICD-10-CM) - Postural kyphosis, thoracic region  ?  ? ?THERAPY DIAG:  ?Chronic midline low back pain with left-sided sciatica ? ?Pain in thoracic spine ? ?PERTINENT HISTORY:  back pain for 10 plus years  ? ?PRECAUTIONS: none ? ?SUBJECTIVE: PT states that her pain is more towards her left side today.  The pain just won't go away  ? ?PAIN:  ?           Yes: NPRS scale: 3/10 ?Pain location: mid back  ?Pain description: stiffness ?Aggravating factors: not sure  ?Relieving factors: rest ? ?TODAY'S TREATMENT  ?              Sitting:   ?               Thoracic excursions x3 ?                W back with 2 #      x10 ?                Scapular retraction x 10  ?                Shoulder ER x 10 with green tband x10 ?              Prone: ?                Rows x10 ?                Shoulder  extension x 10  ? ?PATIENT EDUCATION:  ?Education details: HEP, plan of care ?Person educated: Patient ?Education method: Explanation, Tactile cues, Verbal cues, and Handouts ?Education comprehension: verbalized understanding, returned demonstration, and needs further education ?  ?  ?HOME EXERCISE PROGRAM: ?               ?Eval:  Supine abdominal bracing, bridge, dead bug ?03/08/23:       Sitting:   ?               Thoracic excursions x3 ?                W back with 2 #      x10 ?                Scapular retraction x 10  ?  Shoulder ER x 10 with green tband x10;   ?                Supine:  pelvic tilt x 10 ?                               Hip adduction isometric 5x after adjustment ?                Manual:  SI adjustment therapist posteriorly rotation on RT with good results   ?ASSESSMENT: ?  ?CLINICAL IMPRESSION: ?Evaluation and goals reviewed with patient.  Noted SI dysfunction with therapist easily able to correct.  PT started on a HEP for both mobility and strengthening of  back area.  Pt able to demonstrate good technique with exercises  ? ?  ?  ?OBJECTIVE IMPAIRMENTS Abnormal gait, decreased activity tolerance, decreased endurance, decreased knowledge of condition, decreased mobility, difficulty walking, decreased ROM, decreased strength, hypomobility, impaired perceived functional ability, impaired flexibility, improper body mechanics, postural dysfunction, and pain.  ?  ?ACTIVITY LIMITATIONS cleaning, community activity, driving, meal prep, occupation, laundry, yard work, shopping, and church.  ?  ?PERSONAL FACTORS Fitness are also affecting patient's functional outcome.  ?  ?  ?REHAB POTENTIAL: Good ?  ?CLINICAL DECISION MAKING: Stable/uncomplicated ?  ?EVALUATION COMPLEXITY: Low ?  ?  ?GOALS: ?Goals reviewed with patient? Yes ?  ?SHORT TERM GOALS: Target date: 02/26/2022 ?  ?Patient will be independent with initial HEP ?  ?Goal status: on going ?  ?  ?LONG TERM GOALS: Target date: 03/12/2022 ?   ?Patient will report 50 % improvement in subjective symptoms to improve tolerance for all functional mobilty ?  ?Goal status: on going  ?  ?2.  Patient will improve FOTO score to predicted value to demonstrate improved functional mobility ?Baseline: 63 ?Goal status: on going ?  ?3.  Patient will improve Left hip Flexion and Left hip extension scores to equal to the Right to demonstrate increased lower extremity strength to navigate steps safely and be on her feet for a full shift at work ?Baseline: Left hip F 4+, Left hip ext 4 ?Goal status: on going  ?  ?4.  Patient will be independent with advanced HEP without complaint of back pain to demonstrate improved core strength and fitness ?  ?Goal status: ongoing  ?  ?  ?  ?  ?PLAN: ?PT FREQUENCY: 1x/week ?  ?PT DURATION: 4 weeks ?  ?PLANNED INTERVENTIONS: Therapeutic exercises, Therapeutic activity, Neuromuscular re-education, Balance training, Gait training, Patient/Family education, Joint manipulation, Joint mobilization, Stair training, DME instructions, Aquatic Therapy, Electrical stimulation, Spinal manipulation, Spinal mobilization, Cryotherapy, Moist heat, Taping, Traction, Ultrasound, Contrast bath, Biofeedback, and Manual therapy. ?  ?PLAN FOR NEXT SESSION: Assess SI ; progress trunk stability and core strengthening exercises.  ? ? ? ? ? ? ? ?Rayetta Humphrey, PT CLT ?[phone]  ?02/26/2022,10:00AM ? ?   ?

## 2022-03-03 NOTE — Therapy (Incomplete)
?OUTPATIENT PHYSICAL THERAPY TREATMENT NOTE ? ? ?Patient Name: Krista Fowler ?MRN: [id number] ?DOB:September 13, 1971, 51 y.o., female ?Today's Date: 03/03/2022 ? ?PCP: Raliegh Ip, DO ?REFERRING PROVIDER:  ? ? Allena Napoleon, MD  ? ? ?Past Medical History:  ?Diagnosis Date  ? GERD (gastroesophageal reflux disease)   ? History of hiatal hernia   ? Urticaria   ? ?Past Surgical History:  ?Procedure Laterality Date  ? ABDOMINAL HYSTERECTOMY    ? partial  ? LAPAROSCOPIC GASTRIC SLEEVE RESECTION N/A 07/20/2017  ? Procedure: LAPAROSCOPIC GASTRIC SLEEVE RESECTION, UPPER ENDO;  Surgeon: Kinsinger, De Blanch, MD;  Location: [facility];  Service: General;  Laterality: N/A;  ? TUBAL LIGATION    ? ?Patient Active Problem List  ? Diagnosis Date Noted  ? Colon cancer screening 10/31/2020  ? Acute diverticulitis 10/08/2015  ? Abdominal pain, left lower quadrant 10/08/2015  ? Morbid obesity ([facility]) 10/08/2015  ? Esophageal reflux 01/24/2015  ? Hyperlipidemia 01/24/2015  ? Vitamin D deficiency 01/17/2015  ? ?REFERRING DIAG: N62 (ICD-10-CM) - Hypertrophy of breast M54.50 (ICD-10-CM) - Low back pain, unspecified M54.6 (ICD-10-CM) - Pain in thoracic spine M40.04 (ICD-10-CM) - Postural kyphosis, thoracic region  ?  ? ?THERAPY DIAG:  ?No diagnosis found. ? ?PERTINENT HISTORY:  back pain for 10 plus years  ? ?PRECAUTIONS: none ? ?SUBJECTIVE: PT states that her pain is more towards her left side today.  The pain just won't go away  ? ?PAIN:  ?           Yes: NPRS scale: 3/10 ?Pain location: mid back  ?Pain description: stiffness ?Aggravating factors: not sure  ?Relieving factors: rest ? ?TODAY'S TREATMENT  ?              Sitting:   ?               Thoracic excursions x3 ?                W back with 2 #      x10 ?                Scapular retraction x 10  ?                Shoulder ER x 10 with green tband x10 ?              Prone: ?                Rows x10 ?                Shoulder extension x 10  ? ?PATIENT EDUCATION:  ?Education details: HEP,  plan of care ?Person educated: Patient ?Education method: Explanation, Tactile cues, Verbal cues, and Handouts ?Education comprehension: verbalized understanding, returned demonstration, and needs further education ?  ?  ?HOME EXERCISE PROGRAM: ?               ?Eval:  Supine abdominal bracing, bridge, dead bug ?March 11, 2023:       Sitting:   ?               Thoracic excursions x3 ?                W back with 2 #      x10 ?                Scapular retraction x 10  ?  Shoulder ER x 10 with green tband x10;   ?                Supine:  pelvic tilt x 10 ?                               Hip adduction isometric 5x after adjustment ?                Manual:  SI adjustment therapist posteriorly rotation on RT with good results   ?ASSESSMENT: ?  ?CLINICAL IMPRESSION: ?Evaluation and goals reviewed with patient.  Noted SI dysfunction with therapist easily able to correct.  PT started on a HEP for both mobility and strengthening of  back area.  Pt able to demonstrate good technique with exercises  ? ?  ?  ?OBJECTIVE IMPAIRMENTS Abnormal gait, decreased activity tolerance, decreased endurance, decreased knowledge of condition, decreased mobility, difficulty walking, decreased ROM, decreased strength, hypomobility, impaired perceived functional ability, impaired flexibility, improper body mechanics, postural dysfunction, and pain.  ?  ?ACTIVITY LIMITATIONS cleaning, community activity, driving, meal prep, occupation, laundry, yard work, shopping, and church.  ?  ?PERSONAL FACTORS Fitness are also affecting patient's functional outcome.  ?  ?  ?REHAB POTENTIAL: Good ?  ?CLINICAL DECISION MAKING: Stable/uncomplicated ?  ?EVALUATION COMPLEXITY: Low ?  ?  ?GOALS: ?Goals reviewed with patient? Yes ?  ?SHORT TERM GOALS: Target date: 02/26/2022 ?  ?Patient will be independent with initial HEP ?  ?Goal status: on going ?  ?  ?LONG TERM GOALS: Target date: 03/12/2022 ?  ?Patient will report 50 % improvement in subjective symptoms to  improve tolerance for all functional mobilty ?  ?Goal status: on going  ?  ?2.  Patient will improve FOTO score to predicted value to demonstrate improved functional mobility ?Baseline: 63 ?Goal status: on going ?  ?3.  Patient will improve Left hip Flexion and Left hip extension scores to equal to the Right to demonstrate increased lower extremity strength to navigate steps safely and be on her feet for a full shift at work ?Baseline: Left hip F 4+, Left hip ext 4 ?Goal status: on going  ?  ?4.  Patient will be independent with advanced HEP without complaint of back pain to demonstrate improved core strength and fitness ?  ?Goal status: ongoing  ?  ?  ?  ?  ?PLAN: ?PT FREQUENCY: 1x/week ?  ?PT DURATION: 4 weeks ?  ?PLANNED INTERVENTIONS: Therapeutic exercises, Therapeutic activity, Neuromuscular re-education, Balance training, Gait training, Patient/Family education, Joint manipulation, Joint mobilization, Stair training, DME instructions, Aquatic Therapy, Electrical stimulation, Spinal manipulation, Spinal mobilization, Cryotherapy, Moist heat, Taping, Traction, Ultrasound, Contrast bath, Biofeedback, and Manual therapy. ?  ?PLAN FOR NEXT SESSION: Assess SI ; progress trunk stability and core strengthening exercises.  ? ? ? ? ? ? ? ?Virgina Organ, PT CLT ?[phone]  ?03/03/2022,10:00AM ? ?   ?

## 2022-03-04 ENCOUNTER — Encounter (HOSPITAL_COMMUNITY): Payer: BC Managed Care – PPO

## 2022-03-10 NOTE — Therapy (Addendum)
?OUTPATIENT PHYSICAL THERAPY TREATMENT NOTE ? ? ?Patient Name: Krista Fowler ?MRN: [id number] ?DOB:06-17-1971, 51 y.o., female ?Today's Date: 03/11/2022 ? ?PCP: Janora Norlander, DO ?REFERRING PROVIDER:  ? ? Cindra Presume, MD  ? ?Visit 3 of 4 ? ? ?Past Medical History:  ?Diagnosis Date  ? GERD (gastroesophageal reflux disease)   ? History of hiatal hernia   ? Urticaria   ? ?Past Surgical History:  ?Procedure Laterality Date  ? ABDOMINAL HYSTERECTOMY    ? partial  ? LAPAROSCOPIC GASTRIC SLEEVE RESECTION N/A 07/20/2017  ? Procedure: LAPAROSCOPIC GASTRIC SLEEVE RESECTION, UPPER ENDO;  Surgeon: Kinsinger, Arta Bruce, MD;  Location: [facility];  Service: General;  Laterality: N/A;  ? TUBAL LIGATION    ? ?Patient Active Problem List  ? Diagnosis Date Noted  ? Colon cancer screening 10/31/2020  ? Acute diverticulitis 10/08/2015  ? Abdominal pain, left lower quadrant 10/08/2015  ? Morbid obesity ([place]) 10/08/2015  ? Esophageal reflux 01/24/2015  ? Hyperlipidemia 01/24/2015  ? Vitamin D deficiency 01/17/2015  ? ?REFERRING DIAG: N62 (ICD-10-CM) - Hypertrophy of breast M54.50 (ICD-10-CM) - Low back pain, unspecified M54.6 (ICD-10-CM) - Pain in thoracic spine M40.04 (ICD-10-CM) - Postural kyphosis, thoracic region  ?  ? ?THERAPY DIAG:  ?Chronic midline low back pain with left-sided sciatica ? ?Pain in thoracic spine ? ?PERTINENT HISTORY:  back pain for 10 plus years  ? ?PRECAUTIONS: none ? ?SUBJECTIVE: Patient reports her pain is not as bad today.  She worked all weekend. She reports she is about 70% better.  ? ?PAIN:  ?           Yes: NPRS scale: 1/10 ?Pain location: mid back  ?Pain description: stiffness ?Aggravating factors: not sure  ?Relieving factors: rest ? ?TODAY'S TREATMENT  ?03/11/22 ?UBE x 4 min backwards ?RTB shoulder retractions 2 x 10 ?RTB shoulder extensions 2 x 10  ?Wall pushups 2 x 10  ? ? ? Sitting:   ?                W back with 2 # x 10 ?                 Shoulder ER 2  x 10 with green tband x10 ?  Prone: ?                 Rows x20 ?                Shoulder extension x 20  ?Supine:  pelvic tilt x 20 ?              Hip adduction isometric  20 x  ?   ? ?PATIENT EDUCATION:  ?Education details: HEP, plan of care ?Person educated: Patient ?Education method: Explanation, Tactile cues, Verbal cues, and Handouts ?Education comprehension: verbalized understanding, returned demonstration, and needs further education ?  ?  ?HOME EXERCISE PROGRAM: ? ?Access Code: [email or web address] ?URL: [email or web address] ?Date: 03/11/2022 ?Prepared by: [facility] ? ?Exercises ?- Shoulder Extension with Resistance  - 1 x daily - 7 x weekly - 2 sets - 10 reps ?- Standing Bilateral Low Shoulder Row with Anchored Resistance  - 1 x daily - 7 x weekly - 2 sets - 10 reps ?- Wall Push Up  - 1 x daily - 7 x weekly - 2 sets - 10 reps ?               ?Eval:  Supine abdominal bracing, bridge, dead bug ?2023/03/14:  Sitting:   ?               Thoracic excursions x3 ?                W back with 2 #      x10 ?                Scapular retraction x 10  ?                Shoulder ER x 10 with green tband x10;   ?                Supine:  pelvic tilt x 10 ?                               Hip adduction isometric 5x after adjustment ?                Manual:  SI adjustment therapist posteriorly rotation on RT with good results   ?ASSESSMENT: ?  ?CLINICAL IMPRESSION: ?Today's session focused on progression of postural stabilization exercise. Added UBE as a warm up, standing theraband rows and extensions. Patient needs postural cues for standing exercises as she tends to forward flex at the trunl Patient will continue to benefit from skilled therapy services to reduce deficits and improve functional level. ? ? ?  ?  ?OBJECTIVE IMPAIRMENTS Abnormal gait, decreased activity tolerance, decreased endurance, decreased knowledge of condition, decreased mobility, difficulty walking, decreased ROM, decreased strength, hypomobility, impaired perceived functional ability,  impaired flexibility, improper body mechanics, postural dysfunction, and pain.  ?  ?ACTIVITY LIMITATIONS cleaning, community activity, driving, meal prep, occupation, laundry, yard work, shopping, and church.  ?  ?PERSONAL FACTORS Fitness are also affecting patient's functional outcome.  ?  ?  ?REHAB POTENTIAL: Good ?  ?CLINICAL DECISION MAKING: Stable/uncomplicated ?  ?EVALUATION COMPLEXITY: Low ?  ?  ?GOALS: ?Goals reviewed with patient? Yes ?  ?SHORT TERM GOALS: Target date: 02/26/2022 ?  ?Patient will be independent with initial HEP ?  ?Goal status: met 03/11/22 ?  ?  ?LONG TERM GOALS: Target date: 03/12/2022 ?  ?Patient will report 50 % improvement in subjective symptoms to improve tolerance for all functional mobilty ?  ?Goal status: met 03/11/22 ?  ?2.  Patient will improve FOTO score to predicted value to demonstrate improved functional mobility ?Baseline: 63 ?Goal status: on going ?  ?3.  Patient will improve Left hip Flexion and Left hip extension scores to equal to the Right to demonstrate increased lower extremity strength to navigate steps safely and be on her feet for a full shift at work ?Baseline: Left hip F 4+, Left hip ext 4 ?Goal status: on going  ?  ?4.  Patient will be independent with advanced HEP without complaint of back pain to demonstrate improved core strength and fitness ?  ?Goal status: ongoing  ?  ?  ?  ?  ?PLAN: ?PT FREQUENCY: 1x/week ?  ?PT DURATION: 4 weeks ?  ?PLANNED INTERVENTIONS: Therapeutic exercises, Therapeutic activity, Neuromuscular re-education, Balance training, Gait training, Patient/Family education, Joint manipulation, Joint mobilization, Stair training, DME instructions, Aquatic Therapy, Electrical stimulation, Spinal manipulation, Spinal mobilization, Cryotherapy, Moist heat, Taping, Traction, Ultrasound, Contrast bath, Biofeedback, and Manual therapy. ?  ?PLAN FOR NEXT SESSION: plan to d/c next visit to [facility] HEP; review goals and HEP, FOTO ? ? ? ? ? ? ?9:53 AM,  03/11/22 ?[name] Small Donnal Debar  MPT ?[facility] ?[place] [phone] ?[phone] ? ?   ?

## 2022-03-11 ENCOUNTER — Ambulatory Visit (HOSPITAL_COMMUNITY): Payer: BC Managed Care – PPO | Attending: Plastic Surgery

## 2022-03-11 DIAGNOSIS — G8929 Other chronic pain: Secondary | ICD-10-CM | POA: Insufficient documentation

## 2022-03-11 DIAGNOSIS — M546 Pain in thoracic spine: Secondary | ICD-10-CM | POA: Insufficient documentation

## 2022-03-11 DIAGNOSIS — M5442 Lumbago with sciatica, left side: Secondary | ICD-10-CM | POA: Diagnosis not present

## 2022-03-13 ENCOUNTER — Telehealth: Payer: Self-pay | Admitting: Family Medicine

## 2022-03-13 NOTE — Telephone Encounter (Signed)
PT SCHEDULED

## 2022-03-13 NOTE — Telephone Encounter (Signed)
Patient needs labs and physical for school, she said that she had a physical in January of this year and would like to talk to nurse about what all she needs before making appt. She needs to have everything complete before June 9th. Please call back and advise.  ?

## 2022-03-18 ENCOUNTER — Ambulatory Visit (HOSPITAL_COMMUNITY): Payer: BC Managed Care – PPO

## 2022-03-18 DIAGNOSIS — M5442 Lumbago with sciatica, left side: Secondary | ICD-10-CM | POA: Diagnosis not present

## 2022-03-18 DIAGNOSIS — G8929 Other chronic pain: Secondary | ICD-10-CM

## 2022-03-18 DIAGNOSIS — M546 Pain in thoracic spine: Secondary | ICD-10-CM

## 2022-03-18 NOTE — Therapy (Signed)
?OUTPATIENT PHYSICAL THERAPY TREATMENT NOTE ? ? ?Patient Name: Krista Fowler ?MRN: [id number] ?DOB:Jun 30, 1971, 51 y.o., female ?Today's Date: 03/18/2022 ? ?PCP: Janora Norlander, DO ?REFERRING PROVIDER:  ? ? Cindra Presume, MD  ? ?PHYSICAL THERAPY DISCHARGE SUMMARY ? ?Visits from Start of Care: 02/12/22 ? ?Current functional level related to goals / functional outcomes: ?Met all but one functional goal ?  ?Remaining deficits: ?Having some continued back pain with work although overall decreased pain levels ?  ?Education / Equipment: ?HEP, posturing  ? ?Patient agrees to discharge. Patient goals were partially met. Patient is being discharged due to being pleased with the current functional level. ? ? ? ?END OF SESSION:  ? PT End of Session - 03/18/22 3810   ? ? Visit Number 4   ? Number of Visits 4   ? Date for PT Re-Evaluation 03/12/22   ? Authorization Type [employer] P[employer]; 30 visit limit; no auth needed   ? Authorization Time Period 12-01-21 to 11-30-22   ? Authorization - Visit Number 4   ? Authorization - Number of Visits 30   ? PT Start Time [phone]   ? PT Stop Time 0858   ? PT Time Calculation (min) 41 min   ? Activity Tolerance Patient tolerated treatment well   ? Behavior During Therapy [facility] for tasks assessed/performed   ? ?  ?  ? ?  ? ? ? ? ?Past Medical History:  ?Diagnosis Date  ? GERD (gastroesophageal reflux disease)   ? History of hiatal hernia   ? Urticaria   ? ?Past Surgical History:  ?Procedure Laterality Date  ? ABDOMINAL HYSTERECTOMY    ? partial  ? LAPAROSCOPIC GASTRIC SLEEVE RESECTION N/A 07/20/2017  ? Procedure: LAPAROSCOPIC GASTRIC SLEEVE RESECTION, UPPER ENDO;  Surgeon: Kinsinger, Arta Bruce, MD;  Location: [facility];  Service: General;  Laterality: N/A;  ? TUBAL LIGATION    ? ?Patient Active Problem List  ? Diagnosis Date Noted  ? Colon cancer screening 10/31/2020  ? Acute diverticulitis 10/08/2015  ? Abdominal pain, left lower quadrant 10/08/2015  ? Morbid obesity ([place]) 10/08/2015  ? Esophageal  reflux 01/24/2015  ? Hyperlipidemia 01/24/2015  ? Vitamin D deficiency 01/17/2015  ? ?REFERRING DIAG: N62 (ICD-10-CM) - Hypertrophy of breast M54.50 (ICD-10-CM) - Low back pain, unspecified M54.6 (ICD-10-CM) - Pain in thoracic spine M40.04 (ICD-10-CM) - Postural kyphosis, thoracic region  ?  ? ?THERAPY DIAG:  ?Chronic midline low back pain with left-sided sciatica ? ?Pain in thoracic spine ? ?PERTINENT HISTORY:  back pain for 10 plus years  ? ?PRECAUTIONS: none ? ?SUBJECTIVE: Patient reports mild pain today only; low back pain that radiates into her left leg ? ?PAIN:  ?           Yes: NPRS scale: 1/10 ?Pain location: low back down left leg ?Pain description: stiffness ?Aggravating factors: not sure  ?Relieving factors: rest ? ?TODAY'S TREATMENT  ? ?03/18/22 ?UBE x 4 min backwards ?RTB shoulder retractions 2 x 10 ?RTB shoulder extensions 2 x 10  ?Wall pushups 2 x 10  ? ? Sitting:   ?                W back with 2 # x 10 ?                 Shoulder ER 2  x 10 with green tband  ?Supine:  pelvic tilt x 20 ?  Hip adduction isometric  20 x  ? ?Prone: ?                Rows x20 ?                Shoulder extension x 20  ?   ? ? ? ? ?03/11/22 ?UBE x 4 min backwards ?RTB shoulder retractions 2 x 10 ?RTB shoulder extensions 2 x 10  ?Wall pushups 2 x 10  ? ? ? Sitting:   ?                W back with 2 # x 10 ?                 Shoulder ER 2  x 10 with green tband x10 ?  Prone: ?                Rows x20 ?                Shoulder extension x 20  ?Supine:  pelvic tilt x 20 ?              Hip adduction isometric  20 x  ?   ? ?PATIENT EDUCATION:  ?Education details: HEP review ?Person educated: Patient ?Education method: Explanation, Tactile cues, Verbal cues, and Handouts ?Education comprehension: verbalized understanding, returned demonstration,   ?  ?HOME EXERCISE PROGRAM: ? ?Exercises ?- Shoulder Extension with Resistance  - 1 x daily - 7 x weekly - 2 sets - 10 reps ?- Standing Bilateral Low Shoulder Row with Anchored  Resistance  - 1 x daily - 7 x weekly - 2 sets - 10 reps ?- Wall Push Up  - 1 x daily - 7 x weekly - 2 sets - 10 reps ?               ?Eval:  Supine abdominal bracing, bridge, dead bug ?07-Mar-2023:       Sitting:   ?               Thoracic excursions x3 ?                W back with 2 #      x10 ?                Scapular retraction x 10  ?                Shoulder ER x 10 with green tband x10;   ?                Supine:  pelvic tilt x 10 ?                               Hip adduction isometric 5x after adjustment ?                Manual:  SI adjustment therapist posteriorly rotation on RT with good results   ?ASSESSMENT: ?  ?CLINICAL IMPRESSION: ?Discharge visit today; review of HEP, postural education, discussed how to contact us with further needs. Patient met 4/5 set rehab goals. Her FOTO score was the same but attributes this to "things she shouldn't be doing with her back" anyway.   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 Instructed patient to keep all her MD appointments going  forward and she verbalizes understanding.  ? ? ?  ?  ?OBJECTIVE IMPAIRMENTS Abnormal gait, decreased activity tolerance, decreased endurance, decreased knowledge of condition, decreased mobility, difficulty walking, decreased ROM, decreased strength, hypomobility, impaired perceived functional ability, impaired flexibility, improper body mechanics, postural dysfunction, and pain.  ?  ?ACTIVITY LIMITATIONS cleaning, community activity, driving, meal prep, occupation, laundry, yard work, shopping, and church.  ?  ?PERSONAL FACTORS Fitness are also affecting patient's functional outcome.  ?  ?  ?REHAB POTENTIAL: Good ?  ?CLINICAL DECISION MAKING: Stable/uncomplicated ?  ?EVALUATION COMPLEXITY: Low ?  ?  ?GOALS: ?Goals reviewed with patient? Yes ?  ?SHORT TERM GOALS: Target date: 02/26/2022 ?  ?Patient will be independent with  initial HEP ?  ?Goal status: met 03/11/22 ?  ?  ?LONG TERM GOALS: Target date: 03/12/2022 ?  ?Patient will report 50 % improvement in subjective symptoms to improve tolerance for all functional mobilty ?  ?Goal status: met 03/11/22 ?  ?2.  Patient will improve FOTO score to predicted value to demonstrate improved functional mobility ?Baseline: 63 ?Goal status: ongoing ?  ?3.  Patient will improve Left hip Flexion and Left hip extension scores to equal to the Right to demonstrate increased lower extremity strength to navigate steps safely and be on her feet for a full shift at work ?Baseline: Left hip F 4+, Left hip ext 4 ?Goal status:  met L hip 5/5 ?  ?4.  Patient will be independent with advanced HEP without complaint of back pain to demonstrate improved core strength and fitness ?  ?Goal status: met ?  ?  ?  ?  ?PLAN: ?PT FREQUENCY: 1x/week ?  ?PT DURATION: 4 weeks ?  ?PLANNED INTERVENTIONS: Therapeutic exercises, Therapeutic activity, Neuromuscular re-education, Balance training, Gait training, Patient/Family education, Joint manipulation, Joint mobilization, Stair training, DME instructions, Aquatic Therapy, Electrical stimulation, Spinal manipulation, Spinal mobilization, Cryotherapy, Moist heat, Taping, Traction, Ultrasound, Contrast bath, Biofeedback, and Manual therapy. ?  ?PLAN FOR NEXT SESSION: discharge to [facility] HEP.  ? ? ? ? ? ? ?8:58 AM, 03/18/22 ?Davaris Youtsey Small Kingston Guiles MPT ?[facility] ?[place] [phone] ?Ph:[phone] ? ?   ?

## 2022-04-02 ENCOUNTER — Ambulatory Visit: Payer: BC Managed Care – PPO | Admitting: Family Medicine

## 2022-04-03 ENCOUNTER — Encounter: Payer: Self-pay | Admitting: Family Medicine

## 2022-04-09 ENCOUNTER — Telehealth: Payer: Self-pay

## 2022-04-09 NOTE — Telephone Encounter (Signed)
Called patient, LMVM status of PT. Only has had 4 visits, need 6. Do we need to send in another referral for additional visits? ?

## 2022-04-11 ENCOUNTER — Ambulatory Visit (INDEPENDENT_AMBULATORY_CARE_PROVIDER_SITE_OTHER): Payer: BC Managed Care – PPO | Admitting: Family Medicine

## 2022-04-11 ENCOUNTER — Encounter: Payer: Self-pay | Admitting: Family Medicine

## 2022-04-11 VITALS — BP 123/77 | HR 85 | Temp 97.9°F | Ht 65.0 in | Wt 288.6 lb

## 2022-04-11 DIAGNOSIS — Z111 Encounter for screening for respiratory tuberculosis: Secondary | ICD-10-CM | POA: Diagnosis not present

## 2022-04-11 DIAGNOSIS — Z020 Encounter for examination for admission to educational institution: Secondary | ICD-10-CM

## 2022-04-11 DIAGNOSIS — Z789 Other specified health status: Secondary | ICD-10-CM | POA: Diagnosis not present

## 2022-04-11 DIAGNOSIS — Z1159 Encounter for screening for other viral diseases: Secondary | ICD-10-CM

## 2022-04-11 DIAGNOSIS — L304 Erythema intertrigo: Secondary | ICD-10-CM

## 2022-04-11 MED ORDER — NYSTATIN 100000 UNIT/GM EX POWD: 1.0000 "application " | Freq: Three times a day (TID) | CUTANEOUS | 1 refills | Status: AC | PRN

## 2022-04-11 NOTE — Addendum Note (Signed)
Addended by: Raliegh Ip on: 04/11/2022 10:16 AM ? ? Modules accepted: Orders ? ?

## 2022-04-11 NOTE — Progress Notes (Signed)
? ?Subjective: ?CC: Needs titers ?PCP: Janora Norlander, DO ?NIO:EVOJJKK Krista Fowler is a 51 y.o. female presenting to clinic today for: ? ?1.  School physical ?Patient had her annual physical at the beginning of the year.  She is here today because she needs titers to prove immunity to several infectious diseases prior to matriculating into school.  She suspects that she was vaccinated but does not have any vaccination record except for her COVID-vaccine and previous MMR and varicella titers which did show immunity back in 2015. ? ? ?ROS: Per HPI ? ?Allergies  ?Allergen Reactions  ? Chlorhexidine Itching  ? ?Past Medical History:  ?Diagnosis Date  ? GERD (gastroesophageal reflux disease)   ? History of hiatal hernia   ? Urticaria   ? ? ?Current Outpatient Medications:  ?  acetaminophen (TYLENOL) 500 MG tablet, Take 500-1,000 mg by mouth every 6 (six) hours as needed (pain.)., Disp: , Rfl:  ?  CALCIUM PO, Take 1 tablet by mouth daily., Disp: , Rfl:  ?  Multiple Vitamins-Minerals (BARIATRIC MULTIVITAMINS/IRON PO), Take 1 tablet by mouth daily., Disp: , Rfl:  ?  nystatin powder, Apply 1 application topically 3 (three) times daily., Disp: 60 g, Rfl: 1 ?  pantoprazole (PROTONIX) 40 MG tablet, Take 1 tablet (40 mg total) by mouth daily., Disp: 90 tablet, Rfl: 3 ?Social History  ? ?Socioeconomic History  ? Marital status: Single  ?  Spouse name: Not on file  ? Number of children: 2  ? Years of education: Not on file  ? Highest education level: Not on file  ?Occupational History  ? Not on file  ?Tobacco Use  ? Smoking status: Never  ? Smokeless tobacco: Never  ?Vaping Use  ? Vaping Use: Never used  ?Substance and Sexual Activity  ? Alcohol use: No  ? Drug use: No  ? Sexual activity: Yes  ?  Birth control/protection: Surgical  ?Other Topics Concern  ? Not on file  ?Social History Narrative  ? 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 Has 2 sons, 1 lives here in [place] and the other in [place].  ? ?Social  Determinants of Health  ? ?Financial Resource Strain: Not on file  ?Food Insecurity: Not on file  ?Transportation Needs: Not on file  ?Physical Activity: Not on file  ?Stress: Not on file  ?Social Connections: Not on file  ?Intimate Partner Violence: Not on file  ? ?Family History  ?Problem Relation Age of Onset  ? Hypertension Mother   ? Arthritis Mother   ? Diabetes Father   ? Hypertension Father   ? Hypertension Brother   ? Leukemia Maternal Grandmother   ? Hypertension Maternal Grandmother   ? Diabetes Paternal Grandmother   ? Cancer Other   ? Stroke Other   ? Heart disease Other   ? ? ?Objective: ?Office vital signs reviewed. ?BP 123/77   Pulse 85   Temp 97.9 ?F (36.6 ?C)   Ht 5' 5" (1.651 m)   Wt 288 lb 9.6 oz (130.9 kg)   SpO2 97%   BMI 48.03 kg/m?  ? ?Physical Examination:  ?General: Awake, alert, obese, well appearing female, No acute distress ? ? ?Assessment/ Plan: ?51 y.o. female  ? ?School physical exam - Plan: Poliovirus (Types 1,3) Ab, QuantiFERON-TB Gold Plus, Hepatitis B surface antibody,quantitative ? ?Need for hepatitis B screening test - Plan: Hepatitis B surface antibody,quantitative ? ?Screening-pulmonary TB - Plan: QuantiFERON-TB Gold Plus ? ?Poliovirus vaccination status unknown - Plan:  Poliovirus (Types 1,3) Ab ? ?Hepatitis B vaccination status unknown - Plan: Hepatitis B surface antibody,quantitative ? ?Form completed for school physical exam. ? ?We will obtain labs to screen for tuberculosis, screen for immunity against hepatitis B and polio since we do not have these vaccine history.  She brought today positive immunity from 2015 against MMR and varicella.  This was completed on the form today ? ?Plan to update vaccines if needed ? ?No orders of the defined types were placed in this encounter. ? ?No orders of the defined types were placed in this encounter. ? ? ? ?Janora Norlander, DO ?[place] ?[phone] ? ?

## 2022-04-22 ENCOUNTER — Telehealth: Payer: Self-pay | Admitting: Family Medicine

## 2022-04-22 LAB — POLIOVIRUS (TYPES 1,3) AB
Polio Virus Antibody Type 1: 1:320 {titer}
Polio Virus Antibody Type 3: <1:10 {titer}

## 2022-04-22 LAB — QUANTIFERON-TB GOLD PLUS
QuantiFERON Mitogen Value: >10 IU/mL
QuantiFERON Nil Value: 0.01 IU/mL
QuantiFERON TB1 Ag Value: 0.17 IU/mL
QuantiFERON TB2 Ag Value: 0.02 IU/mL
QuantiFERON-TB Gold Plus: NEGATIVE

## 2022-04-22 LAB — HEPATITIS B SURFACE ANTIBODY, QUANTITATIVE: Hepatitis B Surf Ab Quant: >1000 m[IU]/mL (ref >9.9)

## 2022-04-22 NOTE — Telephone Encounter (Signed)
Still waiting on lab results to come back to finish completing these forms , as soon as they have been received we will call pt to let her know she can pick them up

## 2022-05-02 ENCOUNTER — Ambulatory Visit: Payer: BC Managed Care – PPO | Admitting: Dietician

## 2022-05-08 ENCOUNTER — Encounter: Payer: BC Managed Care – PPO | Admitting: Dietician

## 2022-06-25 ENCOUNTER — Telehealth: Payer: Self-pay | Admitting: Surgical

## 2022-06-25 NOTE — Telephone Encounter (Signed)
Left vmail message for patient to return call to schedule f/u appt after P/T, and to schedule N/C consult with one of our providers.

## 2022-08-19 ENCOUNTER — Ambulatory Visit (INDEPENDENT_AMBULATORY_CARE_PROVIDER_SITE_OTHER): Payer: Self-pay

## 2022-08-19 ENCOUNTER — Ambulatory Visit: Payer: BC Managed Care – PPO

## 2022-08-19 DIAGNOSIS — Z23 Encounter for immunization: Secondary | ICD-10-CM

## 2022-08-27 ENCOUNTER — Ambulatory Visit: Payer: Self-pay

## 2022-09-15 ENCOUNTER — Other Ambulatory Visit: Payer: Self-pay

## 2022-09-15 ENCOUNTER — Encounter (HOSPITAL_COMMUNITY): Payer: Self-pay

## 2022-09-15 ENCOUNTER — Emergency Department (HOSPITAL_COMMUNITY): Payer: Self-pay

## 2022-09-15 ENCOUNTER — Emergency Department (HOSPITAL_COMMUNITY)
Admission: EM | Admit: 2022-09-15 | Discharge: 2022-09-15 | Disposition: A | Discharge status: Home / Self Care | Payer: Self-pay | Attending: Emergency Medicine | Admitting: Emergency Medicine

## 2022-09-15 DIAGNOSIS — N3001 Acute cystitis with hematuria: Secondary | ICD-10-CM | POA: Insufficient documentation

## 2022-09-15 DIAGNOSIS — K0889 Other specified disorders of teeth and supporting structures: Secondary | ICD-10-CM | POA: Insufficient documentation

## 2022-09-15 LAB — CBC WITH DIFFERENTIAL/PLATELET
Abs Immature Granulocytes: 0.01 10*3/uL (ref 0.00–0.07)
Basophils Absolute: 0 10*3/uL (ref 0.0–0.1)
Basophils Relative: 0 %
Eosinophils Absolute: 0.1 10*3/uL (ref 0.0–0.5)
Eosinophils Relative: 1 %
HCT: 43.8 % (ref 36.0–46.0)
Hemoglobin: 14 g/dL (ref 12.0–15.0)
Immature Granulocytes: 0 %
Lymphocytes Relative: 51 %
Lymphs Abs: 3.4 10*3/uL (ref 0.7–4.0)
MCH: 27.8 pg (ref 26.0–34.0)
MCHC: 32 g/dL (ref 30.0–36.0)
MCV: 87.1 fL (ref 80.0–100.0)
Monocytes Absolute: 0.6 10*3/uL (ref 0.1–1.0)
Monocytes Relative: 9 %
Neutro Abs: 2.6 10*3/uL (ref 1.7–7.7)
Neutrophils Relative %: 39 %
Platelets: 196 10*3/uL (ref 150–400)
RBC: 5.03 MIL/uL (ref 3.87–5.11)
RDW: 13.6 % (ref 11.5–15.5)
WBC: 6.6 10*3/uL (ref 4.0–10.5)
nRBC: 0 % (ref 0.0–0.2)

## 2022-09-15 LAB — URINALYSIS, ROUTINE W REFLEX MICROSCOPIC
Bilirubin Urine: NEGATIVE
Glucose, UA: NEGATIVE mg/dL
Ketones, ur: NEGATIVE mg/dL
Nitrite: POSITIVE — AB
Protein, ur: 30 mg/dL — AB
Specific Gravity, Urine: 1.015 (ref 1.005–1.030)
WBC, UA: >50 WBC/hpf — ABNORMAL HIGH (ref 0–5)
pH: 5 (ref 5.0–8.0)

## 2022-09-15 LAB — BASIC METABOLIC PANEL
Anion gap: 10 (ref 5–15)
BUN: 15 mg/dL (ref 6–20)
CO2: 24 mmol/L (ref 22–32)
Calcium: 9.4 mg/dL (ref 8.9–10.3)
Chloride: 105 mmol/L (ref 98–111)
Creatinine, Ser: 0.98 mg/dL (ref 0.44–1.00)
GFR, Estimated: >60 mL/min (ref >60)
Glucose, Bld: 82 mg/dL (ref 70–99)
Potassium: 4.2 mmol/L (ref 3.5–5.1)
Sodium: 139 mmol/L (ref 135–145)

## 2022-09-15 MED ORDER — HYDROCODONE-ACETAMINOPHEN 5-325 MG PO TABS
1.0000 | ORAL_TABLET | Freq: Once | ORAL | Status: AC
  Administered 2022-09-15: 1 via ORAL
  Filled 2022-09-15: qty 1

## 2022-09-15 MED ORDER — HYDROCODONE-ACETAMINOPHEN 5-325 MG PO TABS: ORAL_TABLET | ORAL | 0 refills | Status: DC

## 2022-09-15 MED ORDER — ONDANSETRON 8 MG PO TBDP
8.0000 mg | ORAL_TABLET | Freq: Once | ORAL | Status: AC
  Administered 2022-09-15: 8 mg via ORAL
  Filled 2022-09-15: qty 1

## 2022-09-15 MED ORDER — CEPHALEXIN 500 MG PO CAPS
500.0000 mg | ORAL_CAPSULE | Freq: Once | ORAL | Status: AC
  Administered 2022-09-15: 500 mg via ORAL
  Filled 2022-09-15: qty 1

## 2022-09-15 MED ORDER — CEPHALEXIN 500 MG PO CAPS: 500.0000 mg | ORAL_CAPSULE | Freq: Four times a day (QID) | ORAL | 0 refills | Status: DC

## 2022-09-15 NOTE — ED Triage Notes (Signed)
Pt presents to ED with urine frequency started on 3-4 days ago.   Pt also complaining of dental pain on lower right side.

## 2022-09-15 NOTE — Discharge Instructions (Signed)
Your work-up here this evening shows that you have a urinary tract infection.  It is important that you take the antibiotic as directed until finished.  Please drink plenty of water and/or cranberry juice.  Also, I have listed some of the local resources for dentistry for you.  Please call one of the numbers listed to arrange follow-up appointment with a dentist.  Return to the emergency department for any new or worsening symptoms.

## 2022-09-16 ENCOUNTER — Telehealth: Payer: Self-pay

## 2022-09-16 NOTE — Telephone Encounter (Signed)
Transition Care Management Follow-up Telephone Call Date of discharge and from where: 09/15/2022 [facility] How have you been since you were released from the hospital? Patient is still having side pain but states that it is improving. Patient was started on Keflex Any questions or concerns? No  Items Reviewed: Did the pt receive and understand the discharge instructions provided? Yes  Medications obtained and verified? Yes  Other? Yes  Any new allergies since your discharge? No  Dietary orders reviewed? NA Do you have support at home? Yes  Home Care and Equipment/Supplies: Were home health services ordered? not applicable If so, what is the name of the agency? NA  Has the agency set up a time to come to the patient's home? not applicable Were any new equipment or medical supplies ordered?  No What is the name of the medical supply agency? NA Were you able to get the supplies/equipment? not applicable Do you have any questions related to the use of the equipment or supplies? No  Functional Questionnaire: (I = Independent and D = Dependent) ADLs: I  Bathing/Dressing- I  Meal Prep- I  Eating- I  Maintaining continence- I  Transferring/Ambulation- I  Managing Meds- I  Follow up appointments reviewed:  PCP Hospital f/u appt confirmed?  Patietn declines at this time will follow up if no improvement or if symptoms worsen.     Hospital f/u appt confirmed?  NA   Are transportation arrangements needed? No  If their condition worsens, is the pt aware to call PCP or go to the Emergency Dept.? Yes Was the patient provided with contact information for the PCP's office or ED? Yes Was to pt encouraged to call back with questions or concerns? Yes

## 2022-09-17 LAB — URINE CULTURE: Culture: >=100000 — AB

## 2022-09-18 ENCOUNTER — Telehealth (HOSPITAL_BASED_OUTPATIENT_CLINIC_OR_DEPARTMENT_OTHER): Payer: Self-pay | Admitting: *Deleted

## 2022-09-18 NOTE — Telephone Encounter (Signed)
Post ED Visit - Positive Culture Follow-up  Culture report reviewed by antimicrobial stewardship pharmacist: [facility] [] Nathan Batchelder, Pharm.D. [] Jeremy Frens, Pharm.D., BCPS AQ-ID [] Mike Maccia, Pharm.D., BCPS [] Elizabeth Martin, Pharm.D., BCPS [] Minh Pham, Pharm.D., BCPS, AAHIVP [] Michelle Turner, Pharm.D., BCPS, AAHIVP [] Rachel Rumbarger, PharmD, BCPS [] Thuy Dang, PharmD, BCPS [] Alison Masters, PharmD, BCPS [] Erin Deja, PharmD [] Catherine Pierce, PharmD, BCPS [] Benjamin Mancheril, PharmD  [facility] [] Michelle Bell, PharmD [] Jigna Gadhia, PharmD [] Nikola Glogovac, PharmD [] Terri Green, Rph [] Rachel (Ellen) Jackson, PharmD [] Justin Legge, PharmD [] Michelle Lilliston, PharmD [] Anh Pham, PharmD [] Leann Poindexter, PharmD [] Christine Shade, PharmD [] Mary Swayne, PharmD [] Erin Williamson, PharmD [] Drew Wofford, PharmD   Positive urine culture Treated with Cephalexin, organism sensitive to the same and no further patient follow-up is required at this time.  Kendall Heetderks, PharmD  Andrena Margerum Talley 09/18/2022, 11:22 AM   

## 2022-09-18 NOTE — ED Provider Notes (Signed)
[facility] EMERGENCY DEPARTMENT Provider Note   CSN: [id number] Arrival date & time: 09/15/22  1436     History  Chief Complaint  Patient presents with   Urinary Frequency   Dental Pain    Krista Fowler is a 51 y.o. female.   Urinary Frequency Pertinent negatives include no chest pain, no abdominal pain and no shortness of breath.  Dental Pain Associated symptoms: no fever         Krista Fowler is a 51 y.o. female who presents to the Emergency Department complaining of increased urinary frequency x4 days.  She also describes having some pressure sensation to her pelvic region.  She denies fever, vomiting, back or flank pain.  No history of kidney stones.  No reported blood in her urine. Also, she is complaining of right lower dental pain with history of dental cavities.  States that one of her molars has broken off.  She denies any neck pain, difficulty swallowing, or facial swelling.  Has not been able to see a dentist.   Home Medications Prior to Admission medications   Medication Sig Start Date End Date Taking? Authorizing Provider  cephALEXin (KEFLEX) 500 MG capsule Take 1 capsule (500 mg total) by mouth 4 (four) times daily. 09/15/22  Yes Forrest Jaroszewski, PA-C  HYDROcodone-acetaminophen (NORCO/VICODIN) 5-325 MG tablet Take one tab po q 4 hrs prn pain 09/15/22  Yes Dauntae Derusha, PA-C  CALCIUM PO Take 1 tablet by mouth daily.    [provider]  Multiple Vitamins-Minerals (BARIATRIC MULTIVITAMINS/IRON PO) Take 1 tablet by mouth daily.    [provider]  nystatin powder Apply 1 application. topically 3 (three) times daily as needed (yeast rash (use ONLY if needed)). 04/11/22   Janora Norlander, DO  pantoprazole (PROTONIX) 40 MG tablet Take 1 tablet (40 mg total) by mouth daily. 12/27/21 12/27/22  Janora Norlander, DO      Allergies    Chlorhexidine    Review of Systems   Review of Systems  Constitutional:  Negative for appetite  change, chills and fever.  HENT:  Positive for dental problem. Negative for ear discharge and ear pain.   Respiratory:  Negative for cough and shortness of breath.   Cardiovascular:  Negative for chest pain.  Gastrointestinal:  Negative for abdominal pain, diarrhea, nausea and vomiting.  Genitourinary:  Positive for dysuria and frequency. Negative for difficulty urinating, flank pain, hematuria, vaginal bleeding, vaginal discharge and vaginal pain.  Musculoskeletal:  Negative for back pain.  Neurological:  Negative for weakness.    Physical Exam Updated Vital Signs BP (!) 146/76   Pulse 68   Temp 98.6 F (37 C) (Oral)   Resp 17   Ht 5\' 5"  (1.651 m)   Wt 127 kg   SpO2 97%   BMI 46.59 kg/m  Physical Exam Vitals and nursing note reviewed.  Constitutional:      General: She is not in acute distress.    Appearance: Normal appearance. She is not toxic-appearing.  HENT:     Right Ear: Tympanic membrane and ear canal normal.     Left Ear: Tympanic membrane and ear canal normal.     Mouth/Throat:     Mouth: Mucous membranes are moist.     Comments: Tenderness to palpation of the right lower second molar.  Tooth appears partially avulsed.  No erythema or edema of the surrounding gums.  No facial swelling or trismus.  Uvula midline nonedematous Cardiovascular:     Rate  and Rhythm: Normal rate and regular rhythm.     Pulses: Normal pulses.  Pulmonary:     Effort: Pulmonary effort is normal.  Abdominal:     Palpations: Abdomen is soft.     Tenderness: There is no abdominal tenderness. There is no right CVA tenderness or left CVA tenderness.  Musculoskeletal:        General: Normal range of motion.  Lymphadenopathy:     Cervical: No cervical adenopathy.  Skin:    General: Skin is warm.     Capillary Refill: Capillary refill takes less than 2 seconds.     Findings: No rash.  Neurological:     General: No focal deficit present.     Mental Status: She is alert.     Sensory: No  sensory deficit.     Motor: No weakness.     ED Results / Procedures / Treatments   Labs (all labs ordered are listed, but only abnormal results are displayed) Labs Reviewed  URINE CULTURE - Abnormal; Notable for the following components:      Result Value   Culture >=100,000 COLONIES/mL ESCHERICHIA COLI (*)    Organism ID, Bacteria ESCHERICHIA COLI (*)    All other components within normal limits  URINALYSIS, ROUTINE W REFLEX MICROSCOPIC - Abnormal; Notable for the following components:   APPearance CLOUDY (*)    Hgb urine dipstick MODERATE (*)    Protein, ur 30 (*)    Nitrite POSITIVE (*)    Leukocytes,Ua LARGE (*)    WBC, UA >50 (*)    Bacteria, UA FEW (*)    All other components within normal limits  CBC WITH DIFFERENTIAL/PLATELET  BASIC METABOLIC PANEL    EKG None  Radiology No results found.  Procedures Procedures    Medications Ordered in ED Medications  HYDROcodone-acetaminophen (NORCO/VICODIN) 5-325 MG per tablet 1 tablet (1 tablet Oral Given 09/15/22 1853)  ondansetron (ZOFRAN-ODT) disintegrating tablet 8 mg (8 mg Oral Given 09/15/22 1853)  cephALEXin (KEFLEX) capsule 500 mg (500 mg Oral Given 09/15/22 1930)    ED Course/ Medical Decision Making/ A&P                           Medical Decision Making Patient here for evaluation of increased urinary frequency x3 to 4 days.  No fever or chills.  No flank pain or history of kidney stones.  She also requesting evaluation for dental pain.  No facial swelling or trismus.  On exam, patient well-appearing nontoxic.  Vital signs reassuring.  No CVA tenderness on exam.  Abdomen is soft without peritoneal signs.  She does have a partially avulsed right lower molar without obvious dental abscess.  No facial edema or trismus.  Differential includes but not limited to acute cystitis, kidney stone, pyelonephritis acute pelvic process.  Dental symptoms would include dental pain secondary to caries, periapical abscess,  Ludewig's angina I suspect her symptoms are related to partially avulsed tooth with likely caries.  I do not appreciate any symptoms suggestive of Ludewig's angina.  Amount and/or Complexity of Data Reviewed Labs: ordered.    Details: Labs interpreted by me, no evidence of leukocytosis, chemistries unremarkable.  Urinalysis shows cloudy urine with nitrite positive, moderate hemoglobin, and large amount of leukocytes with greater than 50 white cells.  Urine culture is pending Radiology: ordered.    Details: CT renal stone study ordered for further evaluation.  No acute abdominal pelvic findings Discussion of management or test interpretation  with external provider(s): Patient here with likely dental pain and acute cystitis with hematuria.  We will begin treatment with cephalexin, urine culture is pending.  She is nontoxic-appearing.  I feel that she is appropriate for discharge home, she will follow-up closely with PCP and return precautions were discussed.  Risk Prescription drug management.           Final Clinical Impression(s) / ED Diagnoses Final diagnoses:  Acute cystitis with hematuria  Pain, dental    Rx / DC Orders ED Discharge Orders          Ordered    cephALEXin (KEFLEX) 500 MG capsule  4 times daily        09/15/22 1909    HYDROcodone-acetaminophen (NORCO/VICODIN) 5-325 MG tablet        09/15/22 1909              Pauline Aus, PA-C 09/18/22 1530    Benjiman Core, MD 09/20/22 [phone]

## 2022-09-21 IMAGING — MG MM DIGITAL SCREENING BILAT W/ TOMO AND CAD
8 of 13 series · 8 of 37 positions shown · non-contrast
Comparison: Previous exam(s).

CLINICAL DATA: Screening.

EXAM:
DIGITAL SCREENING BILATERAL MAMMOGRAM WITH TOMOSYNTHESIS AND CAD
TECHNIQUE: Bilateral screening digital craniocaudal and mediolateral oblique
mammograms were obtained. Bilateral screening digital breast
tomosynthesis was performed. The images were evaluated with
computer-aided detection.

[R MLO synth-2D (1 of 2)]
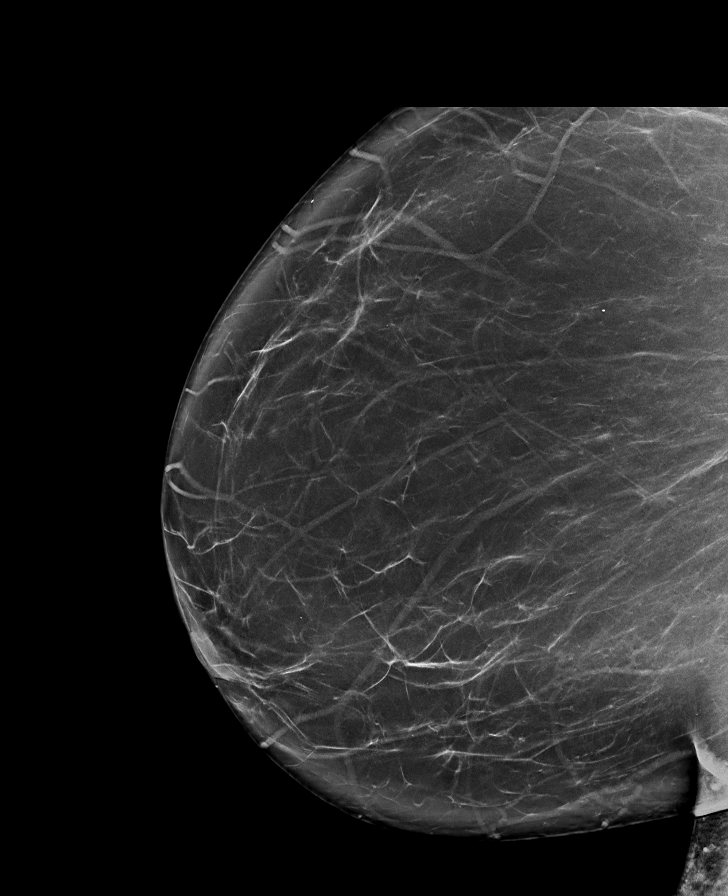

[L MLO synth-2D]
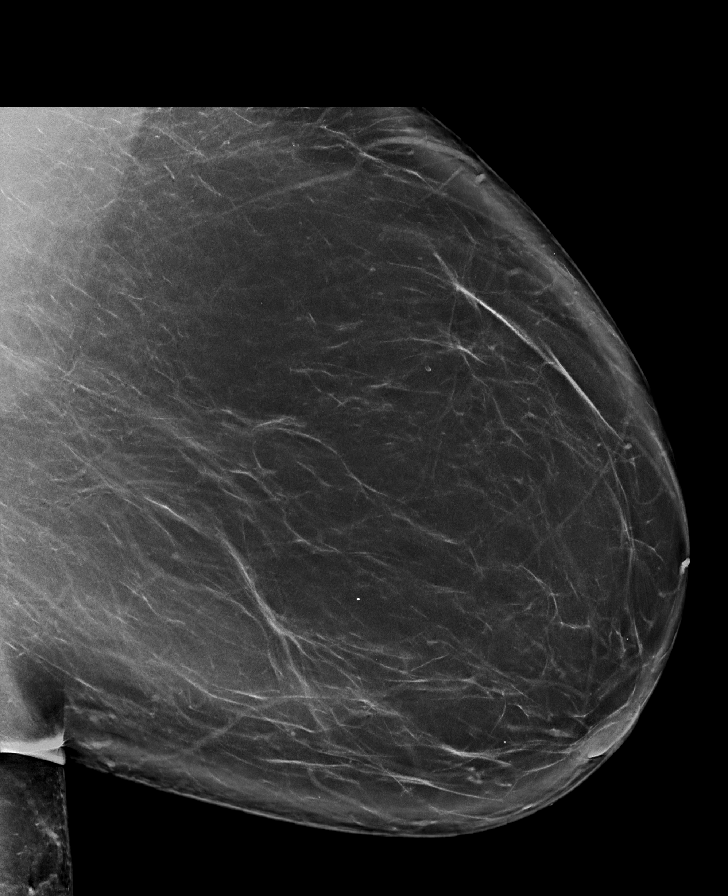

[L CC synth-2D (1 of 2)]
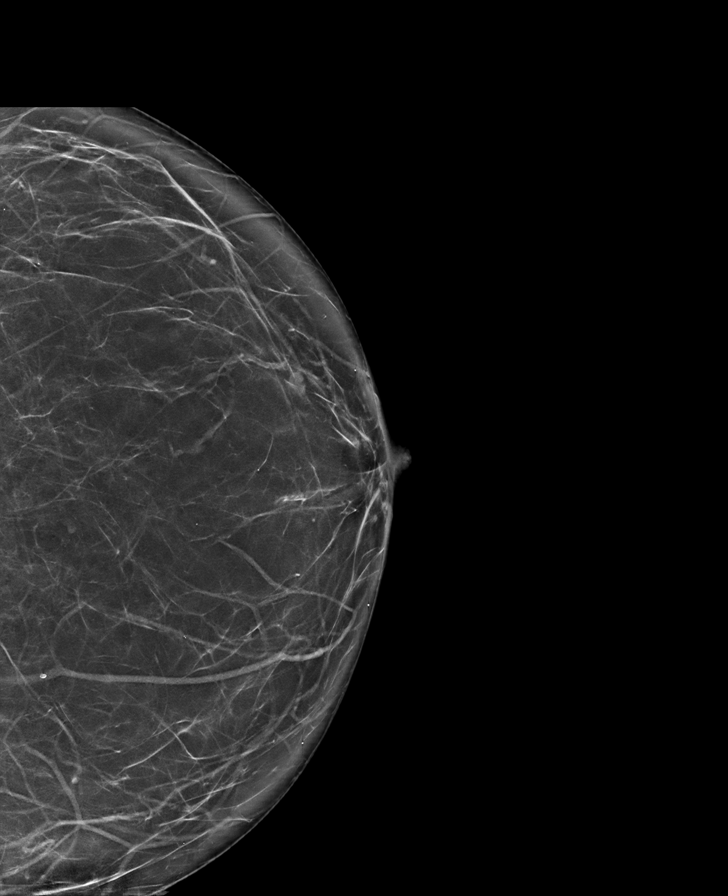

[R CV synth-2D]
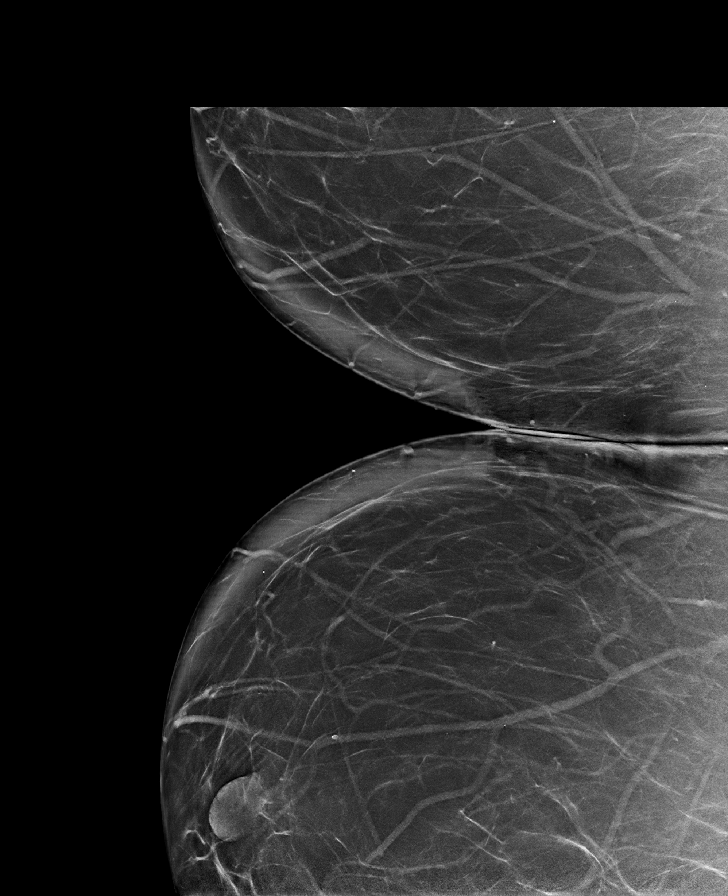

[L CC synth-2D (2 of 2)]
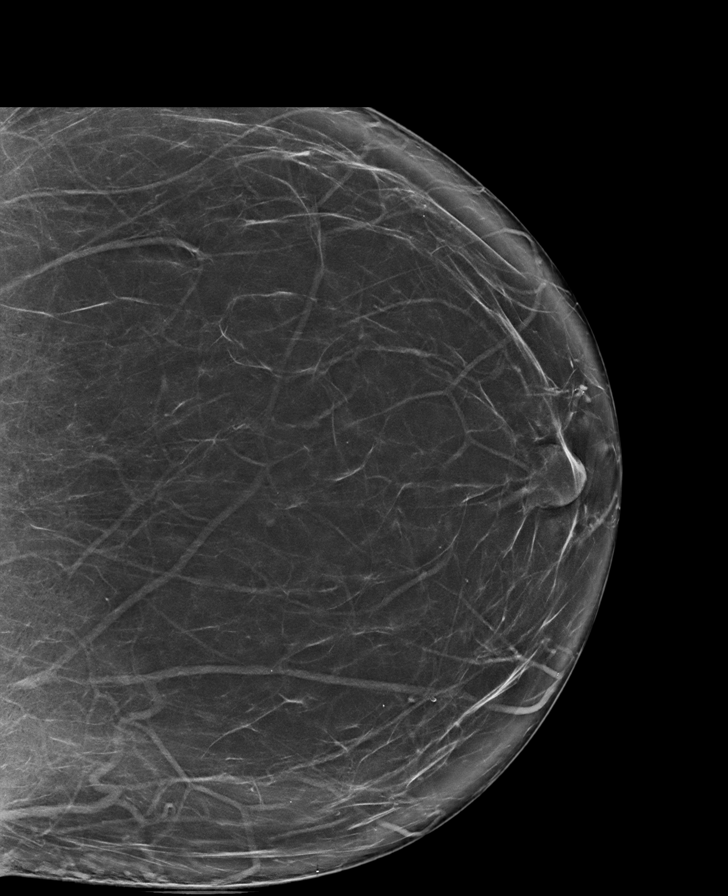

[R MLO synth-2D (2 of 2)]
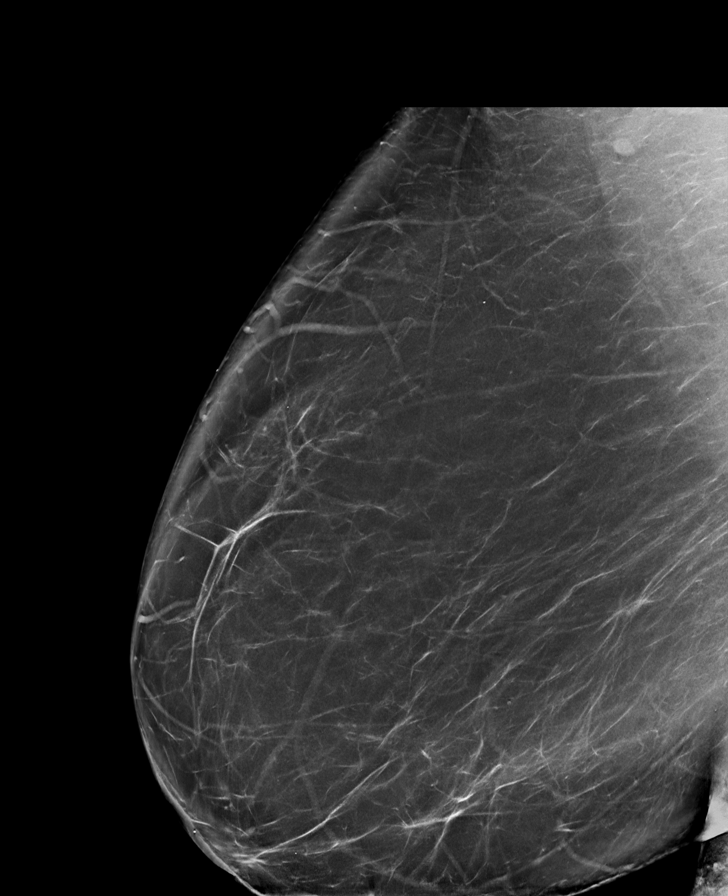

[R CC synth-2D]
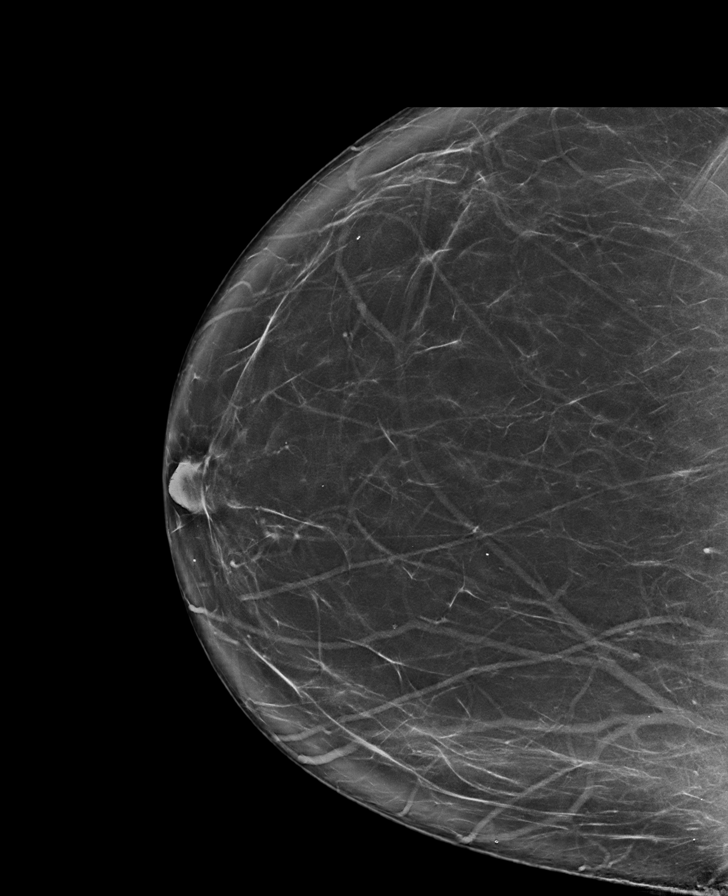

[R CC tomo · tomo slice 49/98.0]
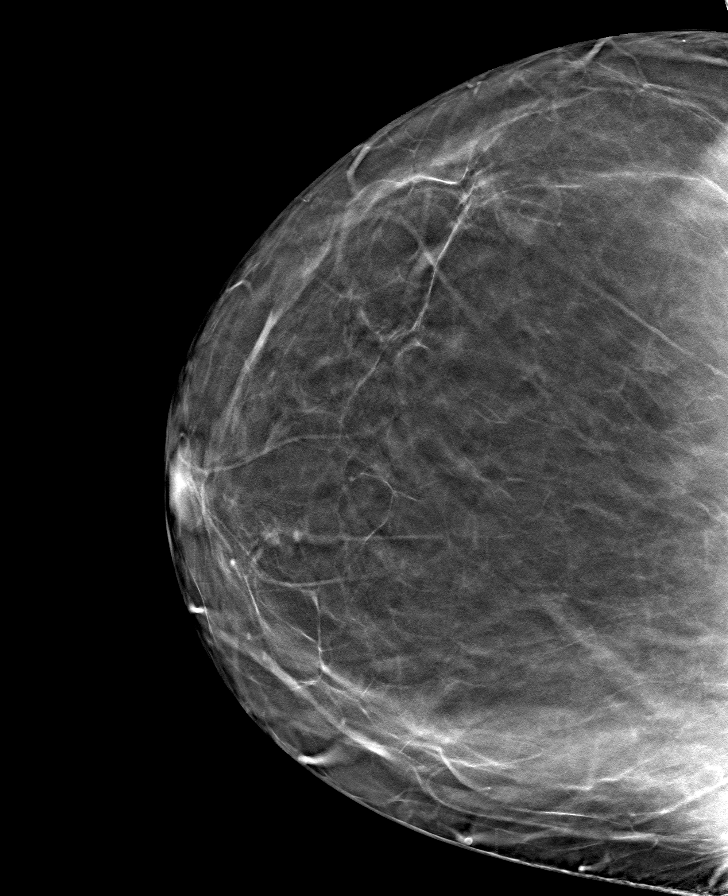

[8 of 37 positions shown; findings below may reference images not displayed]

ACR Breast Density Category b: There are scattered areas of
fibroglandular density.
FINDINGS: There are no findings suspicious for malignancy.
IMPRESSION: No mammographic evidence of malignancy. A result letter of this
screening mammogram will be mailed directly to the patient.

RECOMMENDATION:
Screening mammogram in one year. (Code:51-O-LD2)

BI-RADS CATEGORY  1: Negative.

## 2022-11-20 ENCOUNTER — Telehealth: Payer: Self-pay | Admitting: Family Medicine

## 2022-11-20 DIAGNOSIS — K219 Gastro-esophageal reflux disease without esophagitis: Secondary | ICD-10-CM

## 2022-11-20 MED ORDER — PANTOPRAZOLE SODIUM 40 MG PO TBEC: 40.0000 mg | DELAYED_RELEASE_TABLET | Freq: Every day | ORAL | 1 refills | Status: DC

## 2022-11-20 NOTE — Telephone Encounter (Signed)
Left message advising protonix sent to correct pharmacy and to call back with any further questions or concerns.

## 2022-11-21 ENCOUNTER — Telehealth: Payer: Self-pay

## 2022-11-21 ENCOUNTER — Other Ambulatory Visit (HOSPITAL_COMMUNITY): Payer: Self-pay

## 2022-11-21 NOTE — Telephone Encounter (Signed)
Pharmacy Patient Advocate Encounter   Received notification from [facility] that prior authorization for Pantoprazole 40mg  tabs is required/requested.  Per Test Claim: drug requires prior authorization   PA submitted on 11/21/22 to (ins) 11/23/22 [place] [employer] via [employer] Status is pending

## 2022-11-26 ENCOUNTER — Other Ambulatory Visit (HOSPITAL_COMMUNITY): Payer: Self-pay

## 2022-11-26 NOTE — Telephone Encounter (Signed)
Pharmacy Patient Advocate Encounter  Prior Authorization for Pantoprazole has been approved.    PA# [id number] Effective dates: 11/21/2022 through 11/20/2023

## 2022-12-23 ENCOUNTER — Telehealth: Payer: Self-pay | Admitting: Family Medicine

## 2022-12-23 NOTE — Telephone Encounter (Signed)
  Prescription Request  12/23/2022  Is this a "Controlled Substance" medicine? no  Have you seen your PCP in the last 2 weeks? no  If YES, route message to pool  -  If NO, patient needs to be scheduled for appointment.  What is the name of the medication or equipment? Pantoprazole 40 mg   Have you contacted your pharmacy to request a refill? no   Which pharmacy would you like this sent to? [facility]   Patient notified that their request is being sent to the clinical staff for review and that they should receive a response within 2 business days.

## 2022-12-23 NOTE — Telephone Encounter (Signed)
LMOVM checking to see what ins pt has for this year. Call on 11/20/22 that [facility] does not take her Aetna & Pantoprazole was sent to [facility] for 3 mos supply w/ a refill. If she does not have A[employer]na this year to let us know and we will send it to [facility], otherwise [facility]

## 2023-02-09 ENCOUNTER — Encounter: Payer: Self-pay | Admitting: Family Medicine

## 2023-02-09 ENCOUNTER — Ambulatory Visit: Payer: Self-pay | Admitting: Family Medicine

## 2023-03-23 ENCOUNTER — Other Ambulatory Visit: Payer: Self-pay | Admitting: Family Medicine

## 2023-03-23 DIAGNOSIS — Z1231 Encounter for screening mammogram for malignant neoplasm of breast: Secondary | ICD-10-CM

## 2023-04-06 ENCOUNTER — Ambulatory Visit: Payer: Self-pay

## 2023-04-07 ENCOUNTER — Ambulatory Visit: Payer: BC Managed Care – PPO | Admitting: Family Medicine

## 2023-04-07 ENCOUNTER — Encounter: Payer: Self-pay | Admitting: Family Medicine

## 2023-04-07 VITALS — BP 126/73 | HR 84 | Temp 98.7°F | Ht 65.0 in | Wt 293.6 lb

## 2023-04-07 DIAGNOSIS — K219 Gastro-esophageal reflux disease without esophagitis: Secondary | ICD-10-CM

## 2023-04-07 DIAGNOSIS — N62 Hypertrophy of breast: Secondary | ICD-10-CM | POA: Diagnosis not present

## 2023-04-07 DIAGNOSIS — M25572 Pain in left ankle and joints of left foot: Secondary | ICD-10-CM | POA: Diagnosis not present

## 2023-04-07 DIAGNOSIS — Z6841 Body Mass Index (BMI) 40.0 and over, adult: Secondary | ICD-10-CM

## 2023-04-07 DIAGNOSIS — Z0001 Encounter for general adult medical examination with abnormal findings: Secondary | ICD-10-CM

## 2023-04-07 DIAGNOSIS — M546 Pain in thoracic spine: Secondary | ICD-10-CM

## 2023-04-07 DIAGNOSIS — G8929 Other chronic pain: Secondary | ICD-10-CM | POA: Diagnosis not present

## 2023-04-07 DIAGNOSIS — Z0000 Encounter for general adult medical examination without abnormal findings: Secondary | ICD-10-CM

## 2023-04-07 DIAGNOSIS — Z903 Acquired absence of stomach [part of]: Secondary | ICD-10-CM

## 2023-04-07 MED ORDER — PANTOPRAZOLE SODIUM 40 MG PO TBEC: 40.0000 mg | DELAYED_RELEASE_TABLET | Freq: Every day | ORAL | 3 refills | Status: DC

## 2023-04-07 NOTE — Progress Notes (Signed)
Krista Fowler is a 52 y.o. female presents to office today for annual physical exam examination.    Concerns today include: 1.  Macromastia, morbid obesity Patient with history of gastric sleeve.  She has follow-up with Dr. Alvan Dame soon.  She notes that she continues to have quite a bit of difficulty with weight loss.  She was going to the gym until about a month ago when she got deterred from continuing to exercise because she was seeing the weight loss.  She admits that she really has not had any specific structured diet during this time.  She is getting married in September and worries about feeling good for her wedding.  She also continues to suffer from very pendulous and heavy breast such that she has quite a bit of upper back pain and digging in her shoulder.  We addressed this last year and she had a referral to physical therapy for her back.  She was supposed to see the breast surgeon following that physical therapy but was lost to follow-up because she started a new education program that conflict did with the scheduling of that appointment.  She has had about a 40 pound weight gain over the last 2 years.  She is subsequently starting to have some left lateral ankle pain and reports that this has been present for the last couple of months.  She denies any preceding injury to that ankle.  Sometimes she wears very supportive footwear but sometimes not.  We tried to get her a GLP for weight loss last year but her insurance would not cover.  She has not checked to see what the formulary is this year  Occupation: works at a care facility, Marital status: fiance, wedding planned 08/08/23, Substance use: none Diet: typical american, Exercise: no structured Last eye exam: needs Last dental exam: UTD Last colonoscopy: UTD Last mammogram: needs Last pap smear: needs Refills needed today: PPI Immunizations needed: Immunization History  Administered Date(s) Administered   Influenza,inj,Quad  PF,6+ Mos 10/09/2015, 08/31/2016, 08/19/2022   Influenza-Unspecified 10/31/2014, 10/19/2020   Td 08/29/2014     Past Medical History:  Diagnosis Date   GERD (gastroesophageal reflux disease)    History of hiatal hernia    Urticaria    Social History   Socioeconomic History   Marital status: Single    Spouse name: Not on file   Number of children: 2   Years of education: Not on file   Highest education level: Not on file  Occupational History   Not on file  Tobacco Use   Smoking status: Never   Smokeless tobacco: Never  Vaping Use   Vaping Use: Never used  Substance and Sexual Activity   Alcohol use: No   Drug use: No   Sexual activity: Yes    Birth control/protection: Surgical  Other Topics Concern   Not on file  Social History Narrative   Works in Scientist, 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   Social Determinants of Health   Financial Resource Strain: Not on file  Food Insecurity: No Food Insecurity (10/28/2017)   Hunger Vital Sign    Worried About Running Out of Food in the Last Year: Never true    Ran Out of Food in the Last Year: Never true  Transportation Needs: Not on file  Physical Activity: Not on file  Stress: Not on file  Social Connections: Not on file  Intimate Partner Violence: Not on file   Past Surgical History:  Procedure Laterality Date   ABDOMINAL HYSTERECTOMY     partial   LAPAROSCOPIC GASTRIC SLEEVE RESECTION N/A 07/20/2017   Procedure: LAPAROSCOPIC GASTRIC SLEEVE RESECTION, UPPER ENDO;  Surgeon: Kinsinger, De Blanch, MD;  Location: [facility];  Service: General;  Laterality: N/A;   TUBAL LIGATION     Family History  Problem Relation Age of Onset   Hypertension Mother    Arthritis Mother    Diabetes Father    Hypertension Father    Hypertension Brother    Leukemia Maternal Grandmother    Hypertension Maternal Grandmother    Diabetes Paternal Grandmother    Cancer Other     Stroke Other    Heart disease Other     Current Outpatient Medications:    CALCIUM PO, Take 1 tablet by mouth daily., Disp: , Rfl:    cephALEXin (KEFLEX) 500 MG capsule, Take 1 capsule (500 mg total) by mouth 4 (four) times daily., Disp: 28 capsule, Rfl: 0   HYDROcodone-acetaminophen (NORCO/VICODIN) 5-325 MG tablet, Take one tab po q 4 hrs prn pain, Disp: 8 tablet, Rfl: 0   Multiple Vitamins-Minerals (BARIATRIC MULTIVITAMINS/IRON PO), Take 1 tablet by mouth daily., Disp: , Rfl:    nystatin powder, Apply 1 application. topically 3 (three) times daily as needed (yeast rash (use ONLY if needed))., Disp: 60 g, Rfl: 1   pantoprazole (PROTONIX) 40 MG tablet, Take 1 tablet (40 mg total) by mouth daily., Disp: 90 tablet, Rfl: 1  Allergies  Allergen Reactions   Chlorhexidine Itching     ROS: Review of Systems Pertinent items noted in HPI and remainder of comprehensive ROS otherwise negative.    Physical exam BP 126/73   Pulse 84   Temp 98.7 F (37.1 C)   Ht 5\' 5"  (1.651 m)   Wt 293 lb 9.6 oz (133.2 kg)   SpO2 97%   BMI 48.86 kg/m  General appearance: alert, cooperative, appears stated age, no distress, and morbidly obese Head: Normocephalic, without obvious abnormality, atraumatic Eyes: negative findings: lids and lashes normal, conjunctivae and sclerae normal, corneas clear, and pupils equal, round, reactive to light and accomodation Ears: normal TM's and external ear canals both ears Nose: Nares normal. Septum midline. Mucosa normal. No drainage or sinus tenderness. Throat: lips, mucosa, and tongue normal; teeth and gums normal Neck: no adenopathy, no carotid bruit, supple, symmetrical, trachea midline, and thyroid not enlarged, symmetric, no tenderness/mass/nodules Back: Increased kyphotic curvature of the thoracic's.  She has visible indentation on bilateral shoulders with subsequent postinflammatory hyperpigmentation.  No CVA tenderness. Lungs: clear to auscultation  bilaterally Heart: regular rate and rhythm, S1, S2 normal, no murmur, click, rub or gallop Abdomen: soft, non-tender; bowel sounds normal; no masses,  no organomegaly Extremities: extremities normal, atraumatic, no cyanosis or edema.  Left ankle with no substantial swelling or tenderness along the ligaments.  She has inversion of bilateral ankles visible. Pulses: 2+ and symmetric Skin: Hyperpigmentation beneath bra straps as above. Normal skin Texture, turgor normal. No rashes or lesions Lymph nodes: Cervical, supraclavicular, and axillary nodes normal. Neurologic: Grossly normal    04/07/2023   10:25 AM 12/27/2021   11:53 AM 03/01/2020    2:07 PM  Depression screen PHQ 2/9  Decreased Interest 0 0 0  Down, Depressed, Hopeless 0 0 0  PHQ - 2 Score 0 0 0  Altered sleeping 0  0  Tired, decreased energy 0  0  Change in appetite 0  0  Feeling bad or failure about yourself  0  0  Trouble concentrating 0  0  Moving slowly or fidgety/restless 0  0  Suicidal thoughts 0  0  PHQ-9 Score 0  0  Difficult doing work/chores Not difficult at all        04/07/2023   10:25 AM 12/27/2021   11:53 AM  GAD 7 : Generalized Anxiety Score  Nervous, Anxious, on Edge 0 0  Control/stop worrying 0 0  Worry too much - different things 0 0  Trouble relaxing 0 0  Restless 0 0  Easily annoyed or irritable 0 0  Afraid - awful might happen 0 0  Total GAD 7 Score 0 0  Anxiety Difficulty  Not difficult at all    Assessment/ Plan: Krista Fowler here for annual physical exam.   Annual physical exam  Morbid obesity (HCC) - Plan: Bayer DCA Hb A1c Waived, CMP14+EGFR, Lipid Panel, T4, Free, TSH, VITAMIN D 25 Hydroxy (Vit-D Deficiency, Fractures), CANCELED: CMP14+EGFR, CANCELED: Lipid Panel, CANCELED: TSH, CANCELED: T4, Free, CANCELED: Bayer DCA Hb A1c Waived, CANCELED: VITAMIN D 25 Hydroxy (Vit-D Deficiency, Fractures)  History of sleeve gastrectomy - Plan: CBC, CMP14+EGFR, Vitamin B12, VITAMIN D 25 Hydroxy  (Vit-D Deficiency, Fractures), CANCELED: CMP14+EGFR, CANCELED: VITAMIN D 25 Hydroxy (Vit-D Deficiency, Fractures), CANCELED: CBC, CANCELED: Vitamin B12  Macromastia - Plan: Ambulatory referral to Plastic Surgery  Chronic bilateral thoracic back pain  Chronic pain of left ankle - Plan: Ambulatory referral to Podiatry  Gastroesophageal reflux disease without esophagitis - Plan: pantoprazole (PROTONIX) 40 MG tablet  ROI for pap results from [facility]  We will have her come back in for fasting labs.  Unfortunately continues to suffer with morbid obesity.  I gave her information about Gildardo Pounds, Zepbound and [id number].  She will see if her insurance covers any of these medications.  If not, I am willing to try and mimic the Qsymia medication with generic Topamax and low-dose phentermine.  Her blood pressure is well-controlled so I see no barriers to use of this type of medication.  She was amenable to this and will contact me via MyChart ASAP.  Will plan for 17-month follow-up on weight loss  Check B12, vitamin D, hemoglobin and electrolytes given history of sleeve gastrectomy  Referral to plastic surgery replaced for management of macromastia.  She has clear anatomic changes due to the weight of these breasts.  I think that she would be an excellent candidate for breast reduction surgery  She has inversion of bilateral ankles which I suspect are what is causing the left-sided ankle pain.  She likely needs orthotics to correct  GERD is slightly exacerbated, particularly during sleep.  She is not is really having so much burning but rather the regurgitation which I suspect is due to the pressure in the abdomen and on the chest in the setting of morbid obesity and macromastia  Counseled on healthy lifestyle choices, including diet (rich in fruits, vegetables and lean meats and low in salt and simple carbohydrates) and exercise (at least 30 minutes of moderate physical activity daily).      Ouita Nish M. Nadine Counts, DO

## 2023-04-07 NOTE — Patient Instructions (Addendum)
Let me know if any are covered Qsymia Contrave [place]  Our records indicate that you are due for your screening mammogram. Your primary care provider has placed order in for your screening mammogram. Please contact the [facility] [phone]  at your earliest convenience to schedule your appointment.  If you are unable to make an appointment at this time or if you have already had your screening mammogram done please let our office know so we can update our records.

## 2023-04-08 ENCOUNTER — Other Ambulatory Visit: Payer: BC Managed Care – PPO

## 2023-04-08 DIAGNOSIS — Z903 Acquired absence of stomach [part of]: Secondary | ICD-10-CM

## 2023-04-08 LAB — LIPID PANEL

## 2023-04-08 LAB — T4, FREE

## 2023-04-08 LAB — BAYER DCA HB A1C WAIVED: HB A1C (BAYER DCA - WAIVED): 6.2 % — ABNORMAL HIGH (ref 4.8–5.6)

## 2023-04-08 LAB — CBC
Hematocrit: 38.1 % (ref 34.0–46.6)
Platelets: 268 10*3/uL (ref 150–450)
WBC: 5.6 10*3/uL (ref 3.4–10.8)

## 2023-04-08 LAB — CMP14+EGFR

## 2023-04-09 LAB — CMP14+EGFR
ALT: 22 IU/L (ref 0–32)
AST: 32 IU/L (ref 0–40)
Albumin/Globulin Ratio: 1 — ABNORMAL LOW (ref 1.2–2.2)
Albumin: 3.9 g/dL (ref 3.8–4.9)
Alkaline Phosphatase: 111 IU/L (ref 44–121)
BUN/Creatinine Ratio: 9 (ref 9–23)
Bilirubin Total: 0.3 mg/dL (ref 0.0–1.2)
CO2: 22 mmol/L (ref 20–29)
Calcium: 9.2 mg/dL (ref 8.7–10.2)
Chloride: 102 mmol/L (ref 96–106)
Globulin, Total: 3.8 g/dL (ref 1.5–4.5)
Potassium: 4.4 mmol/L (ref 3.5–5.2)
Sodium: 140 mmol/L (ref 134–144)
eGFR: 62 mL/min/{1.73_m2} (ref >59)

## 2023-04-09 LAB — CBC
Hemoglobin: 12.6 g/dL (ref 11.1–15.9)
MCH: 27.8 pg (ref 26.6–33.0)
MCHC: 33.1 g/dL (ref 31.5–35.7)
MCV: 84 fL (ref 79–97)
RBC: 4.54 x10E6/uL (ref 3.77–5.28)
RDW: 14 % (ref 11.7–15.4)

## 2023-04-09 LAB — LIPID PANEL
HDL: 68 mg/dL (ref >39)
LDL Chol Calc (NIH): 104 mg/dL — ABNORMAL HIGH (ref 0–99)
VLDL Cholesterol Cal: 12 mg/dL (ref 5–40)

## 2023-04-09 LAB — VITAMIN B12: Vitamin B-12: 392 pg/mL (ref 232–1245)

## 2023-04-09 LAB — VITAMIN D 25 HYDROXY (VIT D DEFICIENCY, FRACTURES): Vit D, 25-Hydroxy: 23.2 ng/mL — ABNORMAL LOW (ref 30.0–100.0)

## 2023-04-09 LAB — TSH: TSH: 0.943 u[IU]/mL (ref 0.450–4.500)

## 2023-04-13 ENCOUNTER — Other Ambulatory Visit: Payer: Self-pay | Admitting: Family Medicine

## 2023-04-13 ENCOUNTER — Telehealth: Payer: Self-pay | Admitting: Family Medicine

## 2023-04-13 DIAGNOSIS — E559 Vitamin D deficiency, unspecified: Secondary | ICD-10-CM

## 2023-04-13 MED ORDER — WEGOVY 1.7 MG/0.75ML ~~LOC~~ SOAJ
1.7000 mg | SUBCUTANEOUS | 0 refills | Status: DC
Start: 2023-04-13 — End: 2023-05-01

## 2023-04-13 MED ORDER — WEGOVY 0.5 MG/0.5ML ~~LOC~~ SOAJ
0.5000 mg | SUBCUTANEOUS | 0 refills | Status: DC
Start: 2023-04-13 — End: 2023-05-01

## 2023-04-13 MED ORDER — WEGOVY 0.25 MG/0.5ML ~~LOC~~ SOAJ
0.2500 mg | SUBCUTANEOUS | 0 refills | Status: DC
Start: 2023-04-13 — End: 2023-05-01

## 2023-04-13 MED ORDER — VITAMIN D (ERGOCALCIFEROL) 1.25 MG (50000 UNIT) PO CAPS
50000.0000 [IU] | ORAL_CAPSULE | ORAL | 3 refills | Status: AC
Start: 2023-04-13 — End: ?

## 2023-04-13 MED ORDER — WEGOVY 1 MG/0.5ML ~~LOC~~ SOAJ
1.0000 mg | SUBCUTANEOUS | 0 refills | Status: DC
Start: 2023-04-13 — End: 2023-05-01

## 2023-04-13 NOTE — Telephone Encounter (Signed)
Patient aware and verbalized understanding. Already has appt

## 2023-04-13 NOTE — Telephone Encounter (Signed)
Patient was told to check with her insurance and call back to let PCP know what they would cover. The medications that she called about were qsymia, contraze, zepbound and wegovy - she said all would be covered accept for contraze.

## 2023-04-13 NOTE — Telephone Encounter (Signed)
I have ordered wegovy please review this with her again and please make sure she has a 3-83m follow up for weight recheck scheduled: Tips for success with Wegovy (and by success, how not to be super sick on your stomach): Eat small meals AVOID heavy foods (fried/ high in carbs like bread, pasta, rice) AVOID carbonated beverages (soda/ beer, as these can increase bloating) DOUBLE your water intake (will help you avoid constipation/ dehydration)  JXBJYN CAN cause: Nausea Abdominal pain Increased acid reflux (sometimes presents as "sour burps") Constipation OR Diarrhea Fatigue (especially when you first start it)

## 2023-04-13 NOTE — Telephone Encounter (Signed)
LMTCB

## 2023-04-13 NOTE — Addendum Note (Signed)
Addended by: Raliegh Ip on: 04/13/2023 11:06 AM   Modules accepted: Orders

## 2023-04-16 ENCOUNTER — Telehealth: Payer: Self-pay | Admitting: Family Medicine

## 2023-04-17 ENCOUNTER — Ambulatory Visit
Admission: RE | Admit: 2023-04-17 | Discharge: 2023-04-17 | Disposition: A | Discharge status: Home / Self Care | Payer: BC Managed Care – PPO | Source: Ambulatory Visit | Attending: Family Medicine

## 2023-04-17 ENCOUNTER — Ambulatory Visit (INDEPENDENT_AMBULATORY_CARE_PROVIDER_SITE_OTHER): Payer: 59

## 2023-04-17 ENCOUNTER — Encounter: Payer: Self-pay | Admitting: Podiatry

## 2023-04-17 ENCOUNTER — Ambulatory Visit (INDEPENDENT_AMBULATORY_CARE_PROVIDER_SITE_OTHER): Payer: BC Managed Care – PPO | Admitting: Podiatry

## 2023-04-17 DIAGNOSIS — M7752 Other enthesopathy of left foot: Secondary | ICD-10-CM | POA: Diagnosis not present

## 2023-04-17 DIAGNOSIS — R600 Localized edema: Secondary | ICD-10-CM

## 2023-04-17 DIAGNOSIS — M79672 Pain in left foot: Secondary | ICD-10-CM

## 2023-04-17 DIAGNOSIS — M21619 Bunion of unspecified foot: Secondary | ICD-10-CM | POA: Diagnosis not present

## 2023-04-17 DIAGNOSIS — Z1231 Encounter for screening mammogram for malignant neoplasm of breast: Secondary | ICD-10-CM

## 2023-04-17 MED ORDER — TRIAMCINOLONE ACETONIDE 10 MG/ML IJ SUSP
10.0000 mg | Freq: Once | INTRAMUSCULAR | Status: AC
Start: 2023-04-17 — End: 2023-04-17
  Administered 2023-04-17: 10 mg

## 2023-04-17 NOTE — Telephone Encounter (Signed)
Your request has been denied.   This health benefit plan does not cover the following services, supplies, drugs or charges: Any treatment or regimen, medical or surgical, for the purpose of reducing or controlling the weight of the member, or for the treatment of obesity, except for surgical treatment of morbid obesity, or as specifically covered by this health benefit plan.

## 2023-04-17 NOTE — Telephone Encounter (Signed)
Please inform pt her insurance does not cover weight loss meds.

## 2023-04-17 NOTE — Telephone Encounter (Signed)
Weight loss aesthetics does but I'm not sure if it's the FDA approved one or they are compounding it.

## 2023-04-17 NOTE — Telephone Encounter (Signed)
Pt has been made aware. 

## 2023-04-17 NOTE — Telephone Encounter (Signed)
Krista Fowler ([place]) Rx #: [id number] 0.25MG /0.5ML auto-injectors  Sent to plan

## 2023-04-20 NOTE — Progress Notes (Signed)
Subjective:   Patient ID: Krista Fowler, female   DOB: 52 y.o.   MRN: [id number]   HPI Patient presents stating she is developed a lot of pain in her left ankle of around 3 months duration.  She does stand a lot at work and she also has chronic bunion deformities which have been hurting her.  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  Patient does not smoke likes to be active   Review of Systems  All other systems reviewed and are negative.       Objective:  Physical Exam Vitals and nursing note reviewed.  Constitutional:      Appearance: She is well-developed.  Pulmonary:     Effort: Pulmonary effort is normal.  Musculoskeletal:        General: Normal range of motion.  Skin:    General: Skin is warm.  Neurological:     Mental Status: She is alert.     Neurovascular status intact muscle strength was found to be adequate range of motion adequate.  Patient is noted to have inflammation pain of the subtalar joint left fluid buildup around the joint surface and has moderate structural bunion deformity bilateral with redness and pain around the first metatarsal head bilateral.  Patient has good digital perfusion well oriented x 3 and has tried wider shoes has tried soaks for the bunion deformity along with padding       Assessment:  Acute capsulitis of the subtalar joint left foot with fluid buildup along with structural bunion deformity bilateral with redness and pain around the first MPJ     Plan:  H&P reviewed condition educating her on deformity.  At this point were focusing on the ankle but I do think structural bunion correction will be in her best interest and she is motivated to get this done.  I went ahead today I did sterile prep I injected the sinus tarsi left 3 mg Kenalog 5 mg Xylocaine and instructed on reduced activity and reappoint again in the next 2 to 3 weeks where we will see the results of this and discussed bunions which were educated  today for distal osteotomy  X-rays indicate patient has significant elevation of the intermetatarsal angle bilateral with angle of approximate 15 degrees bilateral 1 to

## 2023-05-01 ENCOUNTER — Encounter: Payer: Self-pay | Admitting: Podiatry

## 2023-05-01 ENCOUNTER — Ambulatory Visit (INDEPENDENT_AMBULATORY_CARE_PROVIDER_SITE_OTHER): Payer: 59 | Admitting: Podiatry

## 2023-05-01 DIAGNOSIS — M21619 Bunion of unspecified foot: Secondary | ICD-10-CM | POA: Diagnosis not present

## 2023-05-01 DIAGNOSIS — M7752 Other enthesopathy of left foot: Secondary | ICD-10-CM

## 2023-05-01 NOTE — Progress Notes (Signed)
Subjective:   Patient ID: Krista Fowler, female   DOB: 52 y.o.   MRN: [id number]   HPI Patient states it is improved quite a bit and I know I have these bad bunions   ROS      Objective:  Physical Exam  Neurovascular status intact significant reduction discomfort in the subtalar joint left with minimal pain and large bunion deformity bilateral     Assessment:  Inflamed sinus tarsitis left improved along with moderate flattening of the arch structural bunion deformity bilateral     Plan:  H&P reviewed condition and recommended stretching exercises supportive shoes discussed bunion correction someday but will hold off currently may be necessary

## 2023-05-20 ENCOUNTER — Institutional Professional Consult (permissible substitution): Payer: BC Managed Care – PPO | Admitting: Plastic Surgery

## 2023-05-27 ENCOUNTER — Institutional Professional Consult (permissible substitution): Payer: BC Managed Care – PPO | Admitting: Plastic Surgery

## 2023-06-11 DIAGNOSIS — L218 Other seborrheic dermatitis: Secondary | ICD-10-CM | POA: Diagnosis not present

## 2023-06-11 DIAGNOSIS — L648 Other androgenic alopecia: Secondary | ICD-10-CM | POA: Diagnosis not present

## 2023-07-02 ENCOUNTER — Ambulatory Visit (INDEPENDENT_AMBULATORY_CARE_PROVIDER_SITE_OTHER): Payer: BC Managed Care – PPO | Admitting: Plastic Surgery

## 2023-07-02 ENCOUNTER — Encounter: Payer: Self-pay | Admitting: Plastic Surgery

## 2023-07-02 VITALS — BP 160/84 | HR 92 | Ht 65.0 in | Wt 285.6 lb

## 2023-07-02 DIAGNOSIS — M546 Pain in thoracic spine: Secondary | ICD-10-CM

## 2023-07-02 DIAGNOSIS — N62 Hypertrophy of breast: Secondary | ICD-10-CM

## 2023-07-02 DIAGNOSIS — M542 Cervicalgia: Secondary | ICD-10-CM

## 2023-07-02 DIAGNOSIS — Z6841 Body Mass Index (BMI) 40.0 and over, adult: Secondary | ICD-10-CM | POA: Diagnosis not present

## 2023-07-02 NOTE — Progress Notes (Signed)
Referring Provider Raliegh Ip, DO [address] [place],  [place] [address]   CC:  Chief Complaint  Patient presents with   Advice Only      Daisi Gayton is an 52 y.o. female.  HPI: Ms. Labella is a 52 year old female who presents today with complaints of upper back and neck pain and grooving in her shoulders from her bra straps which she attributes to the large size of her breast.  She states that her breast have been large most of her life and she has had pain for many years due to their large size.  She is requesting a breast reduction  Allergies  Allergen Reactions   Chlorhexidine Itching    Outpatient Encounter Medications as of 07/02/2023  Medication Sig   CALCIUM PO Take 1 tablet by mouth daily.   Multiple Vitamins-Minerals (BARIATRIC MULTIVITAMINS/IRON PO) Take 1 tablet by mouth daily.   nystatin powder Apply 1 application. topically 3 (three) times daily as needed (yeast rash (use ONLY if needed)).   pantoprazole (PROTONIX) 40 MG tablet Take 1 tablet (40 mg total) by mouth daily.   Vitamin D, Ergocalciferol, (DRISDOL) 1.25 MG (50000 UNIT) CAPS capsule Take 1 capsule (50,000 Units total) by mouth every 7 (seven) days.   No facility-administered encounter medications on file as of 07/02/2023.     Past Medical History:  Diagnosis Date   GERD (gastroesophageal reflux disease)    History of hiatal hernia    Urticaria     Past Surgical History:  Procedure Laterality Date   ABDOMINAL HYSTERECTOMY     partial   LAPAROSCOPIC GASTRIC SLEEVE RESECTION N/A 07/20/2017   Procedure: LAPAROSCOPIC GASTRIC SLEEVE RESECTION, UPPER ENDO;  Surgeon: Kinsinger, De Blanch, MD;  Location: [facility];  Service: General;  Laterality: N/A;   TUBAL LIGATION      Family History  Problem Relation Age of Onset   Hypertension Mother    Arthritis Mother    Diabetes Father    Hypertension Father    Hypertension Brother    Leukemia Maternal Grandmother    Hypertension Maternal  Grandmother    Diabetes Paternal Grandmother    Cancer Other    Stroke Other    Heart disease Other     Social History   Social History Narrative   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     Review of Systems General: Denies fevers, chills, weight loss CV: Denies chest pain, shortness of breath, palpitations Breast: No specific complaints regarding the breasts other than the large size.  Physical Exam    07/02/2023   12:49 PM 04/07/2023   10:25 AM 09/15/2022    7:33 PM  Vitals with BMI  Height 5\' 5"  5\' 5"    Weight 285 lbs 10 oz 293 lbs 10 oz   BMI 47.53 48.86   Systolic 160 126 191  Diastolic 84 73 76  Pulse 92 84 68    General:  No acute distress,  Alert and oriented, Non-Toxic, Normal speech and affect Breast: Not examined today Mammogram: Mammogram May 2024 BI-RADS 1 Assessment/Plan Macromastia: Patient was previously seen in this office and was approved for a bilateral breast reduction though she needed to complete physical therapy prior to surgical scheduling.  She has completed this.  Unfortunately I disagree with the decision to proceed with a breast reduction on a patient with a BMI of 48.  I have discussed this with her I have asked that she continue with weight loss which she has been doing with a goal of obtaining a BMI of 40 or less.  This would be a weight of approximately 245 pounds.  I believe this is a reasonable request to decrease both the patient's risk and the risk of complications with the reduction.  She understands and will return when her BMI is closer to 40.  She may follow-up intermittently with the PAs.  Santiago Glad 07/02/2023, 1:35 PM

## 2023-07-08 ENCOUNTER — Ambulatory Visit (INDEPENDENT_AMBULATORY_CARE_PROVIDER_SITE_OTHER): Payer: BC Managed Care – PPO | Admitting: Family Medicine

## 2023-07-08 ENCOUNTER — Encounter: Payer: Self-pay | Admitting: Family Medicine

## 2023-07-08 DIAGNOSIS — N62 Hypertrophy of breast: Secondary | ICD-10-CM

## 2023-07-08 DIAGNOSIS — R030 Elevated blood-pressure reading, without diagnosis of hypertension: Secondary | ICD-10-CM

## 2023-07-08 DIAGNOSIS — M546 Pain in thoracic spine: Secondary | ICD-10-CM

## 2023-07-08 DIAGNOSIS — G8929 Other chronic pain: Secondary | ICD-10-CM

## 2023-07-08 DIAGNOSIS — Z903 Acquired absence of stomach [part of]: Secondary | ICD-10-CM | POA: Diagnosis not present

## 2023-07-08 NOTE — Progress Notes (Signed)
Subjective: CC: Morbid obesity PCP: Raliegh Ip, DO [id number] Krista Fowler is a 52 y.o. female presenting to clinic today for:  1.  Morbid obesity/macromastia Since her last visit patient was started on semaglutide.  She is down 13 pounds and has not yet reached max dose.  This is being compounded and obtained through a medical weight loss center in [place].  She denies any nausea, vomiting, abdominal pain or changes in bowel habits.  She notes that she was approved to get the breast reduction surgery last year but when she went back there was a different doctor and they told her that she had a lose 40 pounds before she could even get the surgery done.  She is actively working on this as this is still one of her goals due to ongoing upper back pain.  She will be getting married September 7 and is really looking forward to this.   ROS: Per HPI  Allergies  Allergen Reactions   Chlorhexidine Itching   Past Medical History:  Diagnosis Date   GERD (gastroesophageal reflux disease)    History of hiatal hernia    Urticaria     Current Outpatient Medications:    CALCIUM PO, Take 1 tablet by mouth daily., Disp: , Rfl:    Multiple Vitamins-Minerals (BARIATRIC MULTIVITAMINS/IRON PO), Take 1 tablet by mouth daily., Disp: , Rfl:    nystatin powder, Apply 1 application. topically 3 (three) times daily as needed (yeast rash (use ONLY if needed))., Disp: 60 g, Rfl: 1   pantoprazole (PROTONIX) 40 MG tablet, Take 1 tablet (40 mg total) by mouth daily., Disp: 90 tablet, Rfl: 3   Vitamin D, Ergocalciferol, (DRISDOL) 1.25 MG (50000 UNIT) CAPS capsule, Take 1 capsule (50,000 Units total) by mouth every 7 (seven) days., Disp: 12 capsule, Rfl: 3 Social History   Socioeconomic History   Marital status: Single    Spouse name: Not on file   Number of children: 2   Years of education: Not on file   Highest education level: Not on file  Occupational History   Not on file  Tobacco Use    Smoking status: Never   Smokeless tobacco: Never  Vaping Use   Vaping status: Never Used  Substance and Sexual Activity   Alcohol use: No   Drug use: No   Sexual activity: Yes    Birth control/protection: Surgical  Other Topics Concern   Not on file  Social History Narrative   Works in Scientist, 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   Social Determinants of Health   Financial Resource Strain: Not on file  Food Insecurity: No Food Insecurity (10/28/2017)   Hunger Vital Sign    Worried About Running Out of Food in the Last Year: Never true    Ran Out of Food in the Last Year: Never true  Transportation Needs: Not on file  Physical Activity: Unknown (07/08/2023)   Exercise Vital Sign    Days of Exercise per Week: 3 days    Minutes of Exercise per Session: Not on file  Stress: Not on file  Social Connections: Not on file  Intimate Partner Violence: Not on file   Family History  Problem Relation Age of Onset   Hypertension Mother    Arthritis Mother    Diabetes Father    Hypertension Father    Hypertension Brother    Leukemia Maternal Grandmother  Hypertension Maternal Grandmother    Diabetes Paternal Grandmother    Cancer Other    Stroke Other    Heart disease Other     Objective: Office vital signs reviewed. BP (!) 144/90   Pulse 78   Temp 98.5 F (36.9 C)   Ht 5\' 5"  (1.651 m)   Wt 280 lb (127 kg)   SpO2 97%   BMI 46.59 kg/m   Physical Examination:  General: Awake, alert, morbidly obese, No acute distress HEENT:  sclera white, MMM Cardio: regular rate and rhythm  Pulm:  normal work of breathing on room air Breast: macromastia present  Assessment/ Plan: 52 y.o. female   Morbid obesity (HCC)  History of sleeve gastrectomy  Macromastia  Chronic bilateral thoracic back pain  Elevated blood-pressure reading without diagnosis of hypertension  Down 13 pounds with compounded GLP.  We  discussed the risk versus benefits of this medication I think that she should continue as she is having success and has not yet reached max dosing.  No apparent complications utilizing medication so far.  I see no barriers to her proceeding with breast reduction surgery  Will plan to see her back in 6 weeks to recheck that blood pressure which was elevated during today's visit  No orders of the defined types were placed in this encounter.  No orders of the defined types were placed in this encounter.    Raliegh Ip, DO [facility] [phone]

## 2023-07-08 NOTE — Addendum Note (Signed)
Addended by: Raliegh Ip on: 07/08/2023 01:00 PM   Modules accepted: Orders

## 2023-08-31 ENCOUNTER — Encounter: Payer: Self-pay | Admitting: Family Medicine

## 2023-08-31 ENCOUNTER — Ambulatory Visit (INDEPENDENT_AMBULATORY_CARE_PROVIDER_SITE_OTHER): Payer: BC Managed Care – PPO | Admitting: Family Medicine

## 2023-08-31 DIAGNOSIS — Z23 Encounter for immunization: Secondary | ICD-10-CM | POA: Diagnosis not present

## 2023-08-31 DIAGNOSIS — R030 Elevated blood-pressure reading, without diagnosis of hypertension: Secondary | ICD-10-CM

## 2023-08-31 DIAGNOSIS — R609 Edema, unspecified: Secondary | ICD-10-CM | POA: Diagnosis not present

## 2023-08-31 DIAGNOSIS — Z903 Acquired absence of stomach [part of]: Secondary | ICD-10-CM | POA: Diagnosis not present

## 2023-08-31 MED ORDER — HYDROCHLOROTHIAZIDE 12.5 MG PO TABS
12.5000 mg | ORAL_TABLET | Freq: Every day | ORAL | 3 refills | Status: AC | PRN
Start: 2023-08-31 — End: ?

## 2023-08-31 NOTE — Progress Notes (Signed)
Subjective: CC: Morbid obesity PCP: Raliegh Ip, DO ZOX:[id number] Krista Fowler is a 52 y.o. female presenting to clinic today for:  1.  Morbid obesity Patient reports that she has been doing okay going back on her semaglutide.  She taken a few weeks break while she was trying to get her wedding planned and completed.  She is now married and very happily so.  She had a great wedding.  She has tolerated reinitiation of the semaglutide and is back on track.  She reports no GI upset.  Needs refill on her Protonix and her vitamin D.  She does report some edema that seems to be dependent and worse on the left than the right.  This is intermittent.  She wears compression hose but they still seem to occur   ROS: Per HPI  Allergies  Allergen Reactions   Chlorhexidine Itching   Past Medical History:  Diagnosis Date   GERD (gastroesophageal reflux disease)    History of hiatal hernia    Urticaria     Current Outpatient Medications:    CALCIUM PO, Take 1 tablet by mouth daily., Disp: , Rfl:    Multiple Vitamins-Minerals (BARIATRIC MULTIVITAMINS/IRON PO), Take 1 tablet by mouth daily., Disp: , Rfl:    nystatin powder, Apply 1 application. topically 3 (three) times daily as needed (yeast rash (use ONLY if needed))., Disp: 60 g, Rfl: 1   pantoprazole (PROTONIX) 40 MG tablet, Take 1 tablet (40 mg total) by mouth daily., Disp: 90 tablet, Rfl: 3   Semaglutide-Weight Management 2.4 MG/0.75ML SOAJ, Inject 2.4 mg into the skin., Disp: , Rfl:    Vitamin D, Ergocalciferol, (DRISDOL) 1.25 MG (50000 UNIT) CAPS capsule, Take 1 capsule (50,000 Units total) by mouth every 7 (seven) days., Disp: 12 capsule, Rfl: 3 Social History   Socioeconomic History   Marital status: Single    Spouse name: Not on file   Number of children: 2   Years of education: Not on file   Highest education level: Not on file  Occupational History   Not on file  Tobacco Use   Smoking status: Never   Smokeless tobacco:  Never  Vaping Use   Vaping status: Never Used  Substance and Sexual Activity   Alcohol use: No   Drug use: No   Sexual activity: Yes    Birth control/protection: Surgical  Other Topics Concern   Not on file  Social History Narrative   Works in Scientist, 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   Social Determinants of Health   Financial Resource Strain: Not on file  Food Insecurity: No Food Insecurity (10/28/2017)   Hunger Vital Sign    Worried About Running Out of Food in the Last Year: Never true    Ran Out of Food in the Last Year: Never true  Transportation Needs: Not on file  Physical Activity: Unknown (07/08/2023)   Exercise Vital Sign    Days of Exercise per Week: 3 days    Minutes of Exercise per Session: Not on file  Stress: Not on file  Social Connections: Not on file  Intimate Partner Violence: Not on file   Family History  Problem Relation Age of Onset   Hypertension Mother    Arthritis Mother    Diabetes Father    Hypertension Father    Hypertension Brother    Leukemia Maternal Grandmother    Hypertension Maternal Grandmother  Diabetes Paternal Grandmother    Cancer Other    Stroke Other    Heart disease Other     Objective: Office vital signs reviewed. BP 115/70   Pulse 78   Temp 98.3 F (36.8 C)   Ht 5\' 5"  (1.651 m)   Wt 278 lb (126.1 kg)   SpO2 97%   BMI 46.26 kg/m   Physical Examination:  General: Awake, alert, morbidly obese, No acute distress HEENT: Sclera white.  Moist mucous membranes Cardio: regular rate and rhythm, S1S2 heard, no murmurs appreciated Pulm: clear to auscultation bilaterally, no wheezes, rhonchi or rales; normal work of breathing on room air Extremities: warm, well perfused, trace ankle edema, no cyanosis or clubbing; +2 pulses bilaterally  Assessment/ Plan: 52 y.o. female   Morbid obesity (HCC)  History of sleeve gastrectomy  Elevated blood-pressure  reading without diagnosis of hypertension  Encounter for immunization - Plan: Flu vaccine trivalent PF, 6mos and older(Flulaval,Afluria,Fluarix,Fluzone)  Dependent edema - Plan: hydrochlorothiazide (HYDRODIURIL) 12.5 MG tablet  2 pounds down despite having taking a 3-week hiatus from the semaglutide.  Continue medication and titrating as tolerated.  She has refills on both the vitamin D and Protonix at the pharmacy and she can collect those when she picks up the HCTZ which I have given her for as needed use for edema.  Caution dehydration with this and potential low blood pressure given her normal blood pressure here in office.  She will monitor things closely.  Continue compression hose, elevation of legs as able   Raliegh Ip, DO [facility] [phone]

## 2024-01-17 ENCOUNTER — Other Ambulatory Visit: Payer: Self-pay

## 2024-01-17 ENCOUNTER — Encounter (HOSPITAL_COMMUNITY): Payer: Self-pay | Admitting: *Deleted

## 2024-01-17 ENCOUNTER — Emergency Department (HOSPITAL_COMMUNITY)
Admission: EM | Admit: 2024-01-17 | Discharge: 2024-01-17 | Disposition: A | Discharge status: Home / Self Care | Payer: BLUE CROSS/BLUE SHIELD | Attending: Emergency Medicine | Admitting: Emergency Medicine

## 2024-01-17 ENCOUNTER — Emergency Department (HOSPITAL_COMMUNITY): Payer: BLUE CROSS/BLUE SHIELD

## 2024-01-17 DIAGNOSIS — U071 COVID-19: Secondary | ICD-10-CM | POA: Insufficient documentation

## 2024-01-17 DIAGNOSIS — R059 Cough, unspecified: Secondary | ICD-10-CM | POA: Diagnosis not present

## 2024-01-17 DIAGNOSIS — R509 Fever, unspecified: Secondary | ICD-10-CM | POA: Diagnosis not present

## 2024-01-17 DIAGNOSIS — R0602 Shortness of breath: Secondary | ICD-10-CM | POA: Diagnosis not present

## 2024-01-17 LAB — RESP PANEL BY RT-PCR (RSV, FLU A&B, COVID)  RVPGX2
Influenza A by PCR: NEGATIVE
Influenza B by PCR: NEGATIVE
Resp Syncytial Virus by PCR: NEGATIVE
SARS Coronavirus 2 by RT PCR: POSITIVE — AB

## 2024-01-17 MED ORDER — ALBUTEROL SULFATE HFA 108 (90 BASE) MCG/ACT IN AERS: 1.0000 | INHALATION_SPRAY | Freq: Four times a day (QID) | RESPIRATORY_TRACT | 0 refills | Status: AC | PRN

## 2024-01-17 NOTE — ED Provider Notes (Signed)
[place] EMERGENCY DEPARTMENT AT [facility] Provider Note   CSN: [id number] Arrival date & time: 01/17/24  1819     History  Chief Complaint  Patient presents with   Shortness of Breath    Krista Fowler is a 53 y.o. female with medical history of GERD, urticaria.  Patient presents to ED for evaluation of flulike symptoms.  Reports that beginning on Thursday she developed fevers, cough, shortness of breath with coughing.  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  She denies nausea, vomiting, abdominal pain, diarrhea.  Denies lightheadedness, dizziness or weakness.  Reports she is been taking Tylenol at home for symptoms.  Denies sore throat, bodyaches or chills.   Shortness of Breath Associated symptoms: cough and fever        Home Medications Prior to Admission medications   Medication Sig Start Date End Date Taking? Authorizing Provider  albuterol (VENTOLIN HFA) 108 (90 Base) MCG/ACT inhaler Inhale 1-2 puffs into the lungs every 6 (six) hours as needed for wheezing or shortness of breath. 01/17/24  Yes Al Decant, PA-C  CALCIUM PO Take 1 tablet by mouth daily.    [provider]  hydrochlorothiazide (HYDRODIURIL) 12.5 MG tablet Take 1 tablet (12.5 mg total) by mouth daily as needed (swelling legs). 08/31/23   Raliegh Ip, DO  Multiple Vitamins-Minerals (BARIATRIC MULTIVITAMINS/IRON PO) Take 1 tablet by mouth daily.    [provider]  nystatin powder Apply 1 application. topically 3 (three) times daily as needed (yeast rash (use ONLY if needed)). 04/11/22   Raliegh Ip, DO  pantoprazole (PROTONIX) 40 MG tablet Take 1 tablet (40 mg total) by mouth daily. 04/07/23   Raliegh Ip, DO  Semaglutide-Weight Management 2.4 MG/0.75ML SOAJ Inject 2.4 mg into the skin.    [provider]  Vitamin D, Ergocalciferol, (DRISDOL) 1.25 MG (50000 UNIT) CAPS capsule Take 1 capsule (50,000 Units  total) by mouth every 7 (seven) days. 04/13/23   Raliegh Ip, DO      Allergies    Chlorhexidine    Review of Systems   Review of Systems  Constitutional:  Positive for fever.  Respiratory:  Positive for cough and shortness of breath.   All other systems reviewed and are negative.   Physical Exam Updated Vital Signs BP (!) 152/74 (BP Location: Right Arm)   Pulse 97   Temp 98.5 F (36.9 C) (Oral)   Resp 20   Ht 5\' 5"  (1.651 m)   Wt 124.7 kg   SpO2 95%   BMI 45.76 kg/m  Physical Exam Vitals and nursing note reviewed.  Constitutional:      General: She is not in acute distress.    Appearance: She is well-developed.  HENT:     Head: Normocephalic and atraumatic.  Eyes:     Conjunctiva/sclera: Conjunctivae normal.  Cardiovascular:     Rate and Rhythm: Normal rate and regular rhythm.     Heart sounds: No murmur heard. Pulmonary:     Effort: Pulmonary effort is normal. No respiratory distress.     Breath sounds: Normal breath sounds. No wheezing.     Comments: Lung sounds clear to auscultation bilaterally Abdominal:     Palpations: Abdomen is soft.     Tenderness: There is no abdominal tenderness.  Musculoskeletal:        General: No swelling.     Cervical back: Neck supple.     Right lower leg: No  edema.     Left lower leg: No edema.  Skin:    General: Skin is warm and dry.     Capillary Refill: Capillary refill takes less than 2 seconds.  Neurological:     Mental Status: She is alert.  Psychiatric:        Mood and Affect: Mood normal.     ED Results / Procedures / Treatments   Labs (all labs ordered are listed, but only abnormal results are displayed) Labs Reviewed  RESP PANEL BY RT-PCR (RSV, FLU A&B, COVID)  RVPGX2 - Abnormal; Notable for the following components:      Result Value   SARS Coronavirus 2 by RT PCR POSITIVE (*)    All other components within normal limits    EKG None  Radiology DG Chest 2 View Result Date: 01/17/2024 CLINICAL  DATA:  fever/cough EXAM: CHEST - 2 VIEW COMPARISON:  February 27, 2017 FINDINGS: The cardiomediastinal silhouette is unchanged in contour. No pleural effusion. No pneumothorax. Bibasilar reticular nodularity and mild bronchial wall prominence. Visualized abdomen is unremarkable. Multilevel degenerative changes of the thoracic spine. IMPRESSION: Bibasilar reticular nodularity and mild bronchial wall prominence. Findings are favored to reflect bronchitis. Electronically Signed   By: Meda Klinefelter M.D.   On: 01/17/2024 19:11    Procedures Procedures   Medications Ordered in ED Medications - No data to display  ED Course/ Medical Decision Making/ A&P  Medical Decision Making Amount and/or Complexity of Data Reviewed Radiology: ordered.   53 year old female presents for evaluation.  Please see HPI for further details.  On examination patient is afebrile and nontachycardic.  Her lung sounds are clear bilaterally, she is not hypoxic.  Abdomen is soft and compressible throughout.  Neurological examinations at baseline.  Posterior oropharynx reveals a uvula that is midline, she is handling secretions appropriately, there is very minimal erythema without exudate.  Patient is COVID-19 positive here.  Chest x-ray shows no consolidations or effusions.  At this time will send patient home with albuterol inhaler.  Have instructed patient to treat symptoms conservatively at home.  Will write her out of work.  Have encouraged her to follow-up with her PCP.  Have given return precautions and she voiced understanding.   Final Clinical Impression(s) / ED Diagnoses Final diagnoses:  COVID-19    Rx / DC Orders ED Discharge Orders          Ordered    albuterol (VENTOLIN HFA) 108 (90 Base) MCG/ACT inhaler  Every 6 hours PRN        01/17/24 2024              Al Decant, PA-C 01/17/24 2025    Loetta Rough, MD 01/17/24 2137

## 2024-01-17 NOTE — ED Notes (Signed)
Patient discharged. Provider spoke to patient. Paperwork given to patient and reviewed. Pt verbalized understanding. VSS. A+Ox4. Patient ambulated out of the ER with steady, independent gait. No iv in place. Work note given to patient.

## 2024-01-17 NOTE — Discharge Instructions (Addendum)
It was a pleasure taking part in your care.  As discussed, you have COVID-19.  Please quarantine yourself at home and wear masks when in public.  Please see attached work note.  Please take Tylenol for headache and fever.  Take ibuprofen for body aches and chills.  You may also take Zicam to help short duration of symptoms.  Utilize albuterol inhaler for shortness of breath.  Return to the ED with any new or worsening symptoms.

## 2024-01-17 NOTE — ED Triage Notes (Signed)
SOB since Thursday.  Fever - highest 101 per pt. Pt coughing in triage.

## 2024-01-20 ENCOUNTER — Ambulatory Visit: Payer: 59 | Admitting: Nurse Practitioner

## 2024-01-20 ENCOUNTER — Encounter: Payer: Self-pay | Admitting: Nurse Practitioner

## 2024-01-20 ENCOUNTER — Inpatient Hospital Stay: Payer: 59 | Admitting: Family Medicine

## 2024-01-20 ENCOUNTER — Ambulatory Visit: Payer: Self-pay | Admitting: Family Medicine

## 2024-01-20 VITALS — BP 122/72 | HR 70 | Temp 97.2°F | Ht 65.0 in | Wt 279.4 lb

## 2024-01-20 DIAGNOSIS — U071 COVID-19: Secondary | ICD-10-CM | POA: Diagnosis not present

## 2024-01-20 DIAGNOSIS — R051 Acute cough: Secondary | ICD-10-CM | POA: Diagnosis not present

## 2024-01-20 DIAGNOSIS — Z09 Encounter for follow-up examination after completed treatment for conditions other than malignant neoplasm: Secondary | ICD-10-CM | POA: Insufficient documentation

## 2024-01-20 MED ORDER — PREDNISONE 10 MG (21) PO TBPK
ORAL_TABLET | ORAL | 0 refills | Status: AC
Start: 2024-01-20 — End: ?

## 2024-01-20 MED ORDER — GUAIFENESIN 400 MG PO TABS
400.0000 mg | ORAL_TABLET | Freq: Four times a day (QID) | ORAL | 0 refills | Status: AC | PRN
Start: 2024-01-20 — End: ?

## 2024-01-20 NOTE — Progress Notes (Signed)
Acute Office Visit  Subjective:     Patient ID: Krista Fowler, female    DOB: Jun 17, 1971, 53 y.o.   MRN: [id number]  Chief Complaint  Patient presents with   Follow-up    Krista Fowler to ER Sunday for SOB cough and fever, dx covid    HPI Krista Fowler is 53 year old female present today January 20, 2024 for posthospital discharge follow-up for COVID The patient is a 53 year old female who presents with ongoing symptoms following a diagnosis of COVID-19 in the ED on 01/17/2024. She was sent home with an inhaler and advised to use over-the-counter Zicam, which she reports is still providing some relief. However, she continues to experience occasional coughing and wheezing. She denies any chest pain, congestion, or fever at this time. There are no new symptoms or significant changes since her initial diagnosis. Review of Systems  Constitutional:  Negative for chills and fever.  HENT:  Negative for ear pain, sinus pain and sore throat.   Respiratory:  Positive for cough and wheezing. Negative for sputum production.   Gastrointestinal:  Negative for diarrhea, nausea and vomiting.  Musculoskeletal:  Negative for myalgias.  Skin:  Negative for itching and rash.  Neurological:  Negative for dizziness and headaches.   Negative unless indicated in HPI    Objective:    BP 122/72   Pulse 70   Temp (!) 97.2 F (36.2 C) (Temporal)   Ht 5\' 5"  (1.651 m)   Wt 279 lb 6.4 oz (126.7 kg)   SpO2 99%   BMI 46.49 kg/m  BP Readings from Last 3 Encounters:  01/20/24 122/72  01/17/24 (!) 160/71  08/31/23 115/70   Wt Readings from Last 3 Encounters:  01/20/24 279 lb 6.4 oz (126.7 kg)  01/17/24 275 lb (124.7 kg)  08/31/23 278 lb (126.1 kg)      Physical Exam Vitals and nursing note reviewed.  Constitutional:      General: She is not in acute distress. HENT:     Head: Normocephalic and atraumatic.     Right Ear: Tympanic membrane, ear canal and external ear normal. There is no impacted cerumen.      Left Ear: Tympanic membrane, ear canal and external ear normal. There is no impacted cerumen.     Nose: Nose normal.  Eyes:     General: No scleral icterus.    Extraocular Movements: Extraocular movements intact.     Conjunctiva/sclera: Conjunctivae normal.     Pupils: Pupils are equal, round, and reactive to light.  Cardiovascular:     Heart sounds: Normal heart sounds.  Pulmonary:     Effort: Pulmonary effort is normal.     Breath sounds: Wheezing present.  Musculoskeletal:        General: Normal range of motion.     Right lower leg: No edema.     Left lower leg: No edema.  Skin:    General: Skin is warm and dry.     Findings: No rash.  Neurological:     Mental Status: She is alert and oriented to person, place, and time.  Psychiatric:        Mood and Affect: Mood normal.        Behavior: Behavior normal.        Thought Content: Thought content normal.        Judgment: Judgment normal.     No results found for any visits on 01/20/24.      Assessment & Plan:  Real time reverse transcriptase PCR  positive for COVID-19 virus -     predniSONE; Use as directed on back of pill pack  Dispense: 21 tablet; Refill: 0  Acute cough -     predniSONE; Use as directed on back of pill pack  Dispense: 21 tablet; Refill: 0 -     guaiFENesin; Take 1 tablet (400 mg total) by mouth every 6 (six) hours as needed.  Dispense: 30 tablet; Refill: 0  Hospital discharge follow-up -     predniSONE; Use as directed on back of pill pack  Dispense: 21 tablet; Refill: 0  Krista Fowler 53 year old [place] female seen today for posthospital discharge/COVID no acute distress Cough: Guaifenesin 400 mg 1 tab 3 times daily as needed for cough; prednisone 10 mg #21 dispense Increase hydration, Tylenol or ibuprofen for fever Encourage healthy lifestyle choices, including diet (rich in fruits, vegetables, and lean proteins, and low in salt and simple carbohydrates) and exercise (at least 30 minutes of  moderate physical activity daily).     The above assessment and management plan was discussed with the patient. The patient verbalized understanding of and has agreed to the management plan. Patient is aware to call the clinic if they develop any new symptoms or if symptoms persist or worsen. Patient is aware when to return to the clinic for a follow-up visit. Patient educated on when it is appropriate to go to the emergency department.  Return if symptoms worsen or fail to improve.  Arrie Aran Santa Lighter, [place] [facility] [address] [place], [place] [address] [phone]  Note: This document was prepared by [employer] voice dictation technology and any errors that results from this process are unintentional.

## 2024-01-20 NOTE — Telephone Encounter (Signed)
Copied from CRM [id number]. Topic: Clinical - Medication Question >> Jan 20, 2024  8:38 AM Krista Fowler wrote: Reason for CRM: PT wants to know if she can provided a Z pack for her bronchitis  Chief Complaint: Cough Symptoms: Cough Frequency: About a week Pertinent Negatives: Patient denies SOB Disposition: [] ED /[] Urgent Care (no appt availability in office) / [x] Appointment(In office/virtual)/ []  [place] [facility]/ [] Home Care/ [] Refused Recommended Disposition /[] [place] [facility]/ []  Follow-up with PCP Additional Notes: Patient called in to report a cough that has been ongoing since last Thursday. Patient stated she was seen in the ED on Sunday for SOB. Patient was treated for Covid and bronchitis. Patient stated that her symptoms have resolved, but the cough is still lingering. Patient stated the cough is dry and causing frequent coughing spells. Patient denied SOB at this time and has an albuterol inhaler at home, if needed. This RN advised patient to be seen within 24 hours. This RN attempted to schedule HFU with direct PCP. No availability for HFU with PCP today. Scheduled virtual HFU with an alternate provider in the office today. This RN advised patient to call back if symptoms worsen. Patient complied.   Reason for Disposition  [1] Continuous (nonstop) coughing interferes with work or school AND [2] no improvement using cough treatment per Care Advice  Answer Assessment - Initial Assessment Questions 1. ONSET: "When did the cough begin?"      Last Thursday  2. SEVERITY: "How bad is the cough today?"      Frequent coughing spells, states it's hard to stop coughing once she stops 3. SPUTUM: "Describe the color of your sputum" (none, dry cough; clear, white, yellow, green)     Denies 4. HEMOPTYSIS: "Are you coughing up any blood?" If so ask: "How much?" (flecks, streaks, tablespoons, etc.)     Denies 5. DIFFICULTY BREATHING: "Are you having difficulty breathing?" If Yes, ask:  "How bad is it?" (e.g., mild, moderate, severe)    - MILD: No SOB at rest, mild SOB with walking, speaks normally in sentences, can lie down, no retractions, pulse < 100.    - MODERATE: SOB at rest, SOB with minimal exertion and prefers to sit, cannot lie down flat, speaks in phrases, mild retractions, audible wheezing, pulse 100-120.    - SEVERE: Very SOB at rest, speaks in single words, struggling to breathe, sitting hunched forward, retractions, pulse > 120      Denies, SOB on Sunday and went to ED- was prescribed albuterol inhaler 6. FEVER: "Do you have a fever?" If Yes, ask: "What is your temperature, how was it measured, and when did it start?"     Denies 7. CARDIAC HISTORY: "Do you have any history of heart disease?" (e.g., heart attack, congestive heart failure)      Denies 8. LUNG HISTORY: "Do you have any history of lung disease?"  (e.g., pulmonary embolus, asthma, emphysema)     Denies 9. PE RISK FACTORS: "Do you have a history of blood clots?" (or: recent major surgery, recent prolonged travel, bedridden)     Denies 10. OTHER SYMPTOMS: "Do you have any other symptoms?" (e.g., runny nose, wheezing, chest pain)       Denies  Protocols used: Cough - Acute Non-Productive-A-AH

## 2024-02-24 ENCOUNTER — Encounter (HOSPITAL_COMMUNITY): Payer: Self-pay | Admitting: *Deleted

## 2024-04-14 ENCOUNTER — Other Ambulatory Visit: Payer: Self-pay | Admitting: Family Medicine

## 2024-04-14 DIAGNOSIS — K219 Gastro-esophageal reflux disease without esophagitis: Secondary | ICD-10-CM

## 2024-04-18 ENCOUNTER — Telehealth: Payer: Self-pay | Admitting: Family Medicine

## 2024-04-19 NOTE — Telephone Encounter (Signed)
 Pt TBS 5/30 at 1120a.

## 2024-04-19 NOTE — Telephone Encounter (Signed)
 Krista Fowler, can you schedule?

## 2024-04-19 NOTE — Telephone Encounter (Signed)
Apt has been made 

## 2024-04-19 NOTE — Telephone Encounter (Signed)
 Looks like we had 1120 opened up today.  See if she can come in

## 2024-04-20 ENCOUNTER — Encounter: Admitting: Nurse Practitioner

## 2024-04-29 ENCOUNTER — Ambulatory Visit: Admitting: Family Medicine

## 2024-04-29 DIAGNOSIS — R7303 Prediabetes: Secondary | ICD-10-CM | POA: Insufficient documentation

## 2024-04-29 NOTE — Progress Notes (Deleted)
 Marieme Baila Rouse is a 53 y.o. female presents to office today for annual physical exam examination.    Concerns today include: 1. ***  Occupation: ***, Marital status: ***, Substance use: *** Health Maintenance Due  Topic Date Due   Zoster Vaccines- Shingrix (1 of 2) Never done   COVID-19 Vaccine (1 - 2024-25 season) Never done   Refills needed today: ***  Immunization History  Administered Date(s) Administered   Influenza, Seasonal, Injecte, Preservative Fre 08/31/2023   Influenza,inj,Quad PF,6+ Mos 10/09/2015, 08/31/2016, 08/19/2022   Influenza-Unspecified 10/31/2014, 10/19/2020   Td 08/29/2014   Past Medical History:  Diagnosis Date   GERD (gastroesophageal reflux disease)    History of hiatal hernia    Urticaria    Social History   Socioeconomic History   Marital status: Single    Spouse name: Not on file   Number of children: 2   Years of education: Not on file   Highest education level: Not on file  Occupational History   Not on file  Tobacco Use   Smoking status: Never   Smokeless tobacco: Never  Vaping Use   Vaping status: Never Used  Substance and Sexual Activity   Alcohol use: No   Drug use: No   Sexual activity: Yes    Birth control/protection: Surgical  Other Topics Concern   Not on file  Social History Narrative   Works in [employer]  office in [place] w/ [employer]   Has 2 sons, 1 lives here in [place] and the other in [place].   Social Drivers of Corporate investment banker Strain: Not on file  Food Insecurity: No Food Insecurity (10/28/2017)   Hunger Vital Sign    Worried About Running Out of Food in the Last Year: Never true    Ran Out of Food in the Last Year: Never true  Transportation Needs: Not on file  Physical Activity: Unknown (07/08/2023)   Exercise Vital Sign    Days of Exercise per Week: 3 days    Minutes of Exercise per Session: Not on file  Stress: Not on file  Social Connections: Not on file  Intimate Partner  Violence: Not on file   Past Surgical History:  Procedure Laterality Date   ABDOMINAL HYSTERECTOMY     partial   LAPAROSCOPIC GASTRIC SLEEVE RESECTION N/A 07/20/2017   Procedure: LAPAROSCOPIC GASTRIC SLEEVE RESECTION, UPPER ENDO;  Surgeon: Derral Flick, MD;  Location: [facility];  Service: General;  Laterality: N/A;   TUBAL LIGATION     Family History  Problem Relation Age of Onset   Hypertension Mother    Arthritis Mother    Diabetes Father    Hypertension Father    Hypertension Brother    Leukemia Maternal Grandmother    Hypertension Maternal Grandmother    Diabetes Paternal Grandmother    Cancer Other    Stroke Other    Heart disease Other     Current Outpatient Medications:    albuterol  (VENTOLIN  HFA) 108 (90 Base) MCG/ACT inhaler, Inhale 1-2 puffs into the lungs every 6 (six) hours as needed for wheezing or shortness of breath., Disp: 18 g, Rfl: 0   CALCIUM PO, Take 1 tablet by mouth daily., Disp: , Rfl:    guaifenesin  (HUMIBID E) 400 MG TABS tablet, Take 1 tablet (400 mg total) by mouth every 6 (six) hours as needed., Disp: 30 tablet, Rfl: 0   hydrochlorothiazide  (HYDRODIURIL ) 12.5 MG tablet, Take 1 tablet (12.5 mg total) by mouth daily as needed (  swelling legs)., Disp: 30 tablet, Rfl: 3   Multiple Vitamins-Minerals (BARIATRIC MULTIVITAMINS/IRON PO), Take 1 tablet by mouth daily., Disp: , Rfl:    nystatin  powder, Apply 1 application. topically 3 (three) times daily as needed (yeast rash (use ONLY if needed))., Disp: 60 g, Rfl: 1   pantoprazole  (PROTONIX ) 40 MG tablet, TAKE 1 TABLET(40 MG) BY MOUTH DAILY, Disp: 90 tablet, Rfl: 0   predniSONE  (STERAPRED UNI-PAK 21 TAB) 10 MG (21) TBPK tablet, Use as directed on back of pill pack, Disp: 21 tablet, Rfl: 0   Semaglutide -Weight Management 2.4 MG/0.75ML SOAJ, Inject 2.4 mg into the skin., Disp: , Rfl:    Vitamin D , Ergocalciferol , (DRISDOL ) 1.25 MG (50000 UNIT) CAPS capsule, Take 1 capsule (50,000 Units total) by mouth every 7  (seven) days., Disp: 12 capsule, Rfl: 3  Allergies  Allergen Reactions   Chlorhexidine  Itching     ROS: Review of Systems {ros; complete:30496}    Physical exam {Exam, Complete:[phone]}      04/07/2023   10:25 AM 12/27/2021   11:53 AM 03/01/2020    2:07 PM  Depression screen PHQ 2/9  Decreased Interest 0 0 0  Down, Depressed, Hopeless 0 0 0  PHQ - 2 Score 0 0 0  Altered sleeping 0  0  Tired, decreased energy 0  0  Change in appetite 0  0  Feeling bad or failure about yourself  0  0  Trouble concentrating 0  0  Moving slowly or fidgety/restless 0  0  Suicidal thoughts 0  0  PHQ-9 Score 0  0  Difficult doing work/chores Not difficult at all        04/07/2023   10:25 AM 12/27/2021   11:53 AM  GAD 7 : Generalized Anxiety Score  Nervous, Anxious, on Edge 0 0  Control/stop worrying 0 0  Worry too much - different things 0 0  Trouble relaxing 0 0  Restless 0 0  Easily annoyed or irritable 0 0  Afraid - awful might happen 0 0  Total GAD 7 Score 0 0  Anxiety Difficulty  Not difficult at all     Assessment/ Plan: Albesa Huguenin here for annual physical exam.   Annual physical exam  School physical exam  Vitamin D  deficiency  Morbid obesity (HCC)  Pure hypercholesterolemia  Prediabetes  Screening, anemia, deficiency, iron  ***  Counseled on healthy lifestyle choices, including diet (rich in fruits, vegetables and lean meats and low in salt and simple carbohydrates) and exercise (at least 30 minutes of moderate physical activity daily).  Patient to follow up ***  Fantasia Jinkins M. Bonnell Butcher, DO

## 2024-05-12 ENCOUNTER — Ambulatory Visit
Admission: EM | Admit: 2024-05-12 | Discharge: 2024-05-12 | Disposition: A | Discharge status: Home / Self Care | Attending: Nurse Practitioner | Admitting: Nurse Practitioner

## 2024-05-12 DIAGNOSIS — K029 Dental caries, unspecified: Secondary | ICD-10-CM

## 2024-05-12 DIAGNOSIS — K0889 Other specified disorders of teeth and supporting structures: Secondary | ICD-10-CM | POA: Diagnosis not present

## 2024-05-12 MED ORDER — AMOXICILLIN-POT CLAVULANATE 875-125 MG PO TABS: 1.0000 | ORAL_TABLET | Freq: Two times a day (BID) | ORAL | 0 refills | Status: AC

## 2024-05-12 MED ORDER — TRAMADOL HCL 50 MG PO TABS: 50.0000 mg | ORAL_TABLET | Freq: Two times a day (BID) | ORAL | 0 refills | Status: AC | PRN

## 2024-05-12 MED ORDER — KETOROLAC TROMETHAMINE 30 MG/ML IJ SOLN
30.0000 mg | Freq: Once | INTRAMUSCULAR | Status: AC
  Administered 2024-05-12: 30 mg via INTRAMUSCULAR

## 2024-05-12 NOTE — Discharge Instructions (Addendum)
 Take medication as prescribed. You may take Tylenol  or ibuprofen as needed for pain or discomfort. Warm salt water gargles 3-4 times daily until symptoms improve. Continue warm compresses to the affected area to help with pain and discomfort. I have provided a dental resource guide for you to follow-up with a dentist.  Recommend following up within the next 7 to 10 days.

## 2024-05-12 NOTE — ED Provider Notes (Signed)
 [facility]    CSN: [id number] Arrival date & time: 05/12/24  1207      History   Chief Complaint No chief complaint on file.   HPI Krista Fowler is a 53 y.o. female.   The history is provided by the patient.   Patient presents for complaints of dental pain is been present for the past 3 days.  She states the pain is on the upper right side of her mouth.  She states that she does have a cavity in that area and states I need a root canal.  Patient states she has been taking several over-the-counter medications for her symptoms to include ibuprofen, Tylenol , BC powder, and prednisone  with minimal relief.  Patient denies fever, chills, facial swelling, chest pain, abdominal pain, nausea, vomiting, or diarrhea.  States that she is scheduled to see her dentist on the 18th of this month.  Past Medical History:  Diagnosis Date   GERD (gastroesophageal reflux disease)    History of hiatal hernia    Urticaria     Patient Active Problem List   Diagnosis Date Noted   Prediabetes 04/29/2024   Abdominal pain, left lower quadrant 10/08/2015   Morbid obesity (HCC) 10/08/2015   Esophageal reflux 01/24/2015   Hyperlipidemia 01/24/2015   Vitamin D  deficiency 01/17/2015    Past Surgical History:  Procedure Laterality Date   ABDOMINAL HYSTERECTOMY     partial   LAPAROSCOPIC GASTRIC SLEEVE RESECTION N/A 07/20/2017   Procedure: LAPAROSCOPIC GASTRIC SLEEVE RESECTION, UPPER ENDO;  Surgeon: Dorrie Gaudier Alphonso Aschoff, MD;  Location: [facility];  Service: General;  Laterality: N/A;   TUBAL LIGATION      OB History   No obstetric history on file.      Home Medications    Prior to Admission medications   Medication Sig Start Date End Date Taking? Authorizing Provider  albuterol  (VENTOLIN  HFA) 108 (90 Base) MCG/ACT inhaler Inhale 1-2 puffs into the lungs every 6 (six) hours as needed for wheezing or shortness of breath. 01/17/24   Adel Aden, PA-C  CALCIUM PO Take 1  tablet by mouth daily.    [provider]  guaifenesin  (HUMIBID E) 400 MG TABS tablet Take 1 tablet (400 mg total) by mouth every 6 (six) hours as needed. 01/20/24   St Annice Kim, NP  hydrochlorothiazide  (HYDRODIURIL ) 12.5 MG tablet Take 1 tablet (12.5 mg total) by mouth daily as needed (swelling legs). 08/31/23   Eliodoro Guerin, DO  Multiple Vitamins-Minerals (BARIATRIC MULTIVITAMINS/IRON PO) Take 1 tablet by mouth daily.    [provider]  nystatin  powder Apply 1 application. topically 3 (three) times daily as needed (yeast rash (use ONLY if needed)). 04/11/22   Eliodoro Guerin, DO  pantoprazole  (PROTONIX ) 40 MG tablet TAKE 1 TABLET(40 MG) BY MOUTH DAILY 04/14/24   Vicky Grange M, DO  predniSONE  (STERAPRED UNI-PAK 21 TAB) 10 MG (21) TBPK tablet Use as directed on back of pill pack 01/20/24   St Verma Gobble, Adell Hones, NP  Semaglutide -Weight Management 2.4 MG/0.75ML SOAJ Inject 2.4 mg into the skin.    [provider]  Vitamin D , Ergocalciferol , (DRISDOL ) 1.25 MG (50000 UNIT) CAPS capsule Take 1 capsule (50,000 Units total) by mouth every 7 (seven) days. 04/13/23   Eliodoro Guerin, DO    Family History Family History  Problem Relation Age of Onset   Hypertension Mother    Arthritis Mother    Diabetes Father    Hypertension Father  Hypertension Brother    Leukemia Maternal Grandmother    Hypertension Maternal Grandmother    Diabetes Paternal Grandmother    Cancer Other    Stroke Other    Heart disease Other     Social History Social History   Tobacco Use   Smoking status: Never   Smokeless tobacco: Never  Vaping Use   Vaping status: Never Used  Substance Use Topics   Alcohol use: No   Drug use: No     Allergies   Chlorhexidine    Review of Systems Review of Systems Per HPI  Physical Exam Triage Vital Signs ED Triage Vitals  Encounter Vitals Group     BP 05/12/24 1228 (!) 145/83     Girls Systolic BP Percentile  --      Girls Diastolic BP Percentile --      Boys Systolic BP Percentile --      Boys Diastolic BP Percentile --      Pulse Rate 05/12/24 1228 63     Resp 05/12/24 1228 18     Temp 05/12/24 1228 97.6 F (36.4 C)     Temp Source 05/12/24 1228 Oral     SpO2 05/12/24 1228 95 %     Weight --      Height --      Head Circumference --      Peak Flow --      Pain Score 05/12/24 1230 10     Pain Loc --      Pain Education --      Exclude from Growth Chart --    No data found.  Updated Vital Signs BP (!) 145/83 (BP Location: Right Arm)   Pulse 63   Temp 97.6 F (36.4 C) (Oral)   Resp 18   SpO2 95%   Visual Acuity Right Eye Distance:   Left Eye Distance:   Bilateral Distance:    Right Eye Near:   Left Eye Near:    Bilateral Near:     Physical Exam Vitals and nursing note reviewed.  Constitutional:      General: She is not in acute distress.    Appearance: Normal appearance.  HENT:     Head: Normocephalic.     Right Ear: Tympanic membrane, ear canal and external ear normal.     Left Ear: Tympanic membrane, ear canal and external ear normal.     Nose: Nose normal.     Mouth/Throat:     Lips: Pink.     Mouth: Mucous membranes are moist.     Dentition: Abnormal dentition. Dental tenderness and dental caries present. No gingival swelling, dental abscesses or gum lesions.     Pharynx: Oropharynx is clear. Uvula midline.    Eyes:     Extraocular Movements: Extraocular movements intact.     Conjunctiva/sclera: Conjunctivae normal.     Pupils: Pupils are equal, round, and reactive to light.    Cardiovascular:     Rate and Rhythm: Normal rate and regular rhythm.     Pulses: Normal pulses.     Heart sounds: Normal heart sounds.  Pulmonary:     Effort: Pulmonary effort is normal.     Breath sounds: Normal breath sounds.  Abdominal:     Palpations: Abdomen is soft.     Tenderness: There is no abdominal tenderness.   Musculoskeletal:     Cervical back: Normal range  of motion.   Skin:    General: Skin is warm and dry.   Neurological:  General: No focal deficit present.     Mental Status: She is alert and oriented to person, place, and time.   Psychiatric:        Mood and Affect: Mood normal.        Behavior: Behavior normal.      UC Treatments / Results  Labs (all labs ordered are listed, but only abnormal results are displayed) Labs Reviewed - No data to display  EKG   Radiology No results found.  Procedures Procedures (including critical care time)  Medications Ordered in UC Medications - No data to display  Initial Impression / Assessment and Plan / UC Course  I have reviewed the triage vital signs and the nursing notes.  Pertinent labs & imaging results that were available during my care of the patient were reviewed by me and considered in my medical decision making (see chart for details).  Toradol 30 mg IM administered for dental pain.  Will start patient on Augmentin 875/125 mg tablets twice daily for the next 7 days, and for more severe pain, tramadol  50 mg prescribed.  Supportive care recommendations were provided and discussed with the patient to include warm salt water gargles, over-the-counter analgesics, and to monitor for worsening symptoms.  Patient advised to follow-up with her dentist as scheduled.  Patient advised to go to the emergency department if symptoms worsen before that time.  Patient was in agreement with this plan of care and verbalizes understanding.  All questions were answered.  Patient stable for discharge.  Work note was provided.  Final Clinical Impressions(s) / UC Diagnoses   Final diagnoses:  None   Discharge Instructions   None    ED Prescriptions   None    PDMP not reviewed this encounter.   Hardy Lia, NP 05/12/24 1301

## 2024-05-12 NOTE — ED Triage Notes (Signed)
 Pt reports dental pain on the right side, ear pain and neck pain present.

## 2024-12-06 ENCOUNTER — Other Ambulatory Visit: Payer: Self-pay | Admitting: Family Medicine

## 2024-12-06 DIAGNOSIS — Z1231 Encounter for screening mammogram for malignant neoplasm of breast: Secondary | ICD-10-CM

## 2024-12-07 ENCOUNTER — Ambulatory Visit
Admission: RE | Admit: 2024-12-07 | Discharge: 2024-12-07 | Disposition: A | Discharge status: Home / Self Care | Source: Ambulatory Visit | Attending: Family Medicine | Admitting: Family Medicine

## 2024-12-07 DIAGNOSIS — Z1231 Encounter for screening mammogram for malignant neoplasm of breast: Secondary | ICD-10-CM

## 2024-12-09 ENCOUNTER — Ambulatory Visit: Payer: Self-pay | Admitting: Family Medicine

## 2024-12-12 ENCOUNTER — Telehealth: Payer: Self-pay

## 2024-12-12 NOTE — Telephone Encounter (Signed)
 Left message for patient to call back, appointment scheduled for 1/28 at 1:00 is for a well check. Appointment needs to be rescheduled to a 30 minute slot. Per Dr. Jolinda, ok to use same day slot on 1/28.

## 2024-12-28 ENCOUNTER — Ambulatory Visit: Admitting: Family Medicine

## 2024-12-28 ENCOUNTER — Encounter: Payer: Self-pay | Admitting: Family Medicine

## 2024-12-28 ENCOUNTER — Telehealth: Payer: Self-pay

## 2024-12-28 NOTE — Telephone Encounter (Signed)
 Reached out to schedule appropriate physical appointment. Left message for patient to call back.

## 2024-12-28 NOTE — Telephone Encounter (Signed)
 Called and spoke with patient. Appt for today at 1PM was scheduled incorrectly by [id number]. Needs to be 30 min Physical appt. Offered patient to be moved to 3PM today for this but patient cant come at that time. Patient requesting another day and time, that's not far out. Okay to work in another day/time?
